# Patient Record
Sex: Male | Born: 1960 | Race: Black or African American | Hispanic: No | Marital: Single | Smoking: Former smoker
Health system: Southern US, Community
[De-identification: ages and names within clinical notes are randomized; demographics above are authoritative.]

## PROBLEM LIST (undated history)

## (undated) DIAGNOSIS — H409 Unspecified glaucoma: Secondary | ICD-10-CM

## (undated) DIAGNOSIS — I10 Essential (primary) hypertension: Secondary | ICD-10-CM

## (undated) DIAGNOSIS — K859 Acute pancreatitis without necrosis or infection, unspecified: Secondary | ICD-10-CM

## (undated) DIAGNOSIS — R531 Weakness: Secondary | ICD-10-CM

## (undated) DIAGNOSIS — M1990 Unspecified osteoarthritis, unspecified site: Secondary | ICD-10-CM

## (undated) DIAGNOSIS — H269 Unspecified cataract: Secondary | ICD-10-CM

## (undated) DIAGNOSIS — E7800 Pure hypercholesterolemia, unspecified: Secondary | ICD-10-CM

## (undated) HISTORY — DX: Unspecified osteoarthritis, unspecified site: M19.90

## (undated) HISTORY — DX: Unspecified cataract: H26.9

## (undated) HISTORY — PX: CATARACT EXTRACTION, BILATERAL: SHX1313

## (undated) HISTORY — DX: Unspecified glaucoma: H40.9

---

## 2007-01-08 ENCOUNTER — Ambulatory Visit (HOSPITAL_COMMUNITY): Admission: RE | Admit: 2007-01-08 | Discharge: 2007-01-08 | Payer: Self-pay | Admitting: *Deleted

## 2007-01-12 ENCOUNTER — Ambulatory Visit (HOSPITAL_COMMUNITY): Admission: RE | Admit: 2007-01-12 | Discharge: 2007-01-12 | Payer: Self-pay | Admitting: General Surgery

## 2008-02-04 ENCOUNTER — Ambulatory Visit (HOSPITAL_COMMUNITY): Admission: RE | Admit: 2008-02-04 | Discharge: 2008-02-04 | Payer: Self-pay | Admitting: Family Medicine

## 2008-12-18 ENCOUNTER — Ambulatory Visit (HOSPITAL_COMMUNITY): Admission: RE | Admit: 2008-12-18 | Discharge: 2008-12-18 | Payer: Self-pay | Admitting: Family Medicine

## 2011-02-04 NOTE — H&P (Signed)
NAME:  Jared Holt, Jared Holt NO.:  [id number]   MEDICAL RECORD NO.:  [id number]          PATIENT TYPE:  AMB   LOCATION:  DAY                           FACILITY:  [facility]   PHYSICIAN:  Dalia Heading, M.D.  DATE OF BIRTH:  [birth year]   DATE OF ADMISSION:  DATE OF DISCHARGE:  LH                              HISTORY & PHYSICAL   CHIEF COMPLAINT:  Hematochezia.   HISTORY OF PRESENT ILLNESS:  The patient is a 50 year old black male who  was referred for endoscopic evaluation.  He needs a colonoscopy for  hematochezia.  He noted blood in his underwear recently.  No abdominal  pain, weight loss, nausea, vomiting, diarrhea, constipation, melena have  been noted.  He has never had a colonoscopy.  There is no family history  of colon carcinoma.   PAST MEDICAL HISTORY:  Unremarkable.   PAST SURGICAL HISTORY:  Unremarkable.   CURRENT MEDICATIONS:  Cholestyramine, metoprolol.   ALLERGIES:  SULFA.   REVIEW OF SYSTEMS:  Noncontributory.   PHYSICAL EXAMINATION:  GENERAL:  Patient is a well-developed, well-  nourished black male in no acute distress.  LUNGS:  Clear to auscultation with equal breath sounds bilaterally.  HEART:  Regular rate and rhythm without S3, S4 or murmurs.  ABDOMEN:  Soft, nontender, nondistended.  No hepatosplenomegaly or  masses are noted.  RECTAL:  Deferred to the procedure.   IMPRESSION:  Hematochezia.   PLAN:  The patient is scheduled for a colonoscopy on 01/12/2007.  The  risks and benefits of the procedure including bleeding and perforation  were fully explained to the patient.  Gave informed consent.      Dalia Heading, M.D.  Electronically Signed     MAJ/MEDQ  D:  01/09/2007  T:  01/09/2007  Job:  [id number]   cc:   Kirk Ruths, M.D.  Fax: [phone]

## 2011-03-14 ENCOUNTER — Inpatient Hospital Stay (INDEPENDENT_AMBULATORY_CARE_PROVIDER_SITE_OTHER)
Admission: RE | Admit: 2011-03-14 | Discharge: 2011-03-14 | Disposition: A | Discharge status: Home / Self Care | Payer: Self-pay | Source: Ambulatory Visit | Attending: Family Medicine | Admitting: Family Medicine

## 2011-03-14 ENCOUNTER — Ambulatory Visit (INDEPENDENT_AMBULATORY_CARE_PROVIDER_SITE_OTHER): Payer: Self-pay

## 2011-03-14 DIAGNOSIS — M545 Low back pain, unspecified: Secondary | ICD-10-CM

## 2011-05-29 ENCOUNTER — Inpatient Hospital Stay (INDEPENDENT_AMBULATORY_CARE_PROVIDER_SITE_OTHER)
Admission: RE | Admit: 2011-05-29 | Discharge: 2011-05-29 | Disposition: A | Discharge status: Home / Self Care | Payer: Self-pay | Source: Ambulatory Visit | Attending: Emergency Medicine | Admitting: Emergency Medicine

## 2011-05-29 DIAGNOSIS — R0789 Other chest pain: Secondary | ICD-10-CM

## 2011-08-31 ENCOUNTER — Emergency Department (INDEPENDENT_AMBULATORY_CARE_PROVIDER_SITE_OTHER)
Admission: EM | Admit: 2011-08-31 | Discharge: 2011-08-31 | Disposition: A | Discharge status: Home / Self Care | Payer: Self-pay | Source: Home / Self Care | Attending: Family Medicine | Admitting: Family Medicine

## 2011-08-31 DIAGNOSIS — R259 Unspecified abnormal involuntary movements: Secondary | ICD-10-CM

## 2011-08-31 DIAGNOSIS — R253 Fasciculation: Secondary | ICD-10-CM

## 2011-08-31 HISTORY — DX: Essential (primary) hypertension: I10

## 2011-08-31 HISTORY — DX: Pure hypercholesterolemia, unspecified: E78.00

## 2011-08-31 HISTORY — DX: Weakness: R53.1

## 2011-08-31 MED ORDER — LORAZEPAM 0.5 MG PO TABS: 0.5000 mg | ORAL_TABLET | Freq: Three times a day (TID) | ORAL | Status: AC | PRN

## 2011-08-31 NOTE — ED Provider Notes (Signed)
History     CSN: [id number] Arrival date & time: 08/31/2011 12:37 PM   First MD Initiated Contact with Patient 08/31/11 1116      Chief Complaint  Patient presents with  . Spasms    (Consider location/radiation/quality/duration/timing/severity/associated sxs/prior treatment) HPI Comments: Jared Holt presents for evaluation of muscle twitching on the RIGHT side of his face beneath his RIGHT eye. He states that this started yesterday. The twitches last seconds, then resolve. He states that he is just concerned because he has been experiencing RIGHT sided weakness; he has seen his PCP and was referred to Neurology with whom he has an appointment on Dec. 31.  Patient is a 50 y.o. male presenting with general illness. The history is provided by the patient.  Illness  The current episode started yesterday. The problem occurs occasionally. The problem has been unchanged. The problem is mild. Pertinent negatives include no decreased vision, no ear pain and no eye pain.    Past Medical History  Diagnosis Date  . Hypertension   . High cholesterol   . Weakness     History reviewed. No pertinent past surgical history.  No family history on file.  History  Substance Use Topics  . Smoking status: Never Smoker   . Smokeless tobacco: Not on file  . Alcohol Use: Yes     social      Review of Systems  Constitutional: Negative.   HENT: Negative for ear pain.   Eyes: Negative for pain.  Respiratory: Negative.   Genitourinary: Negative.   Musculoskeletal:       Facial muscle spasm, RIGHT side beneath RIGHT eye    Allergies  Sulfa antibiotics  Home Medications   Current Outpatient Rx  Name Route Sig Dispense Refill  . IBUPROFEN 400 MG PO TABS Oral Take 400 mg by mouth every 6 (six) hours as needed.      Marland Kitchen LORAZEPAM 0.5 MG PO TABS Oral Take 1 tablet (0.5 mg total) by mouth every 8 (eight) hours as needed (Take as needed, up to every 8 hours, for muscle spasm). 30 tablet 0    BP  111/70  Pulse 108  Temp(Src) 98.4 F (36.9 C) (Oral)  Resp 18  SpO2 98%  Physical Exam  Nursing note and vitals reviewed. Constitutional: He is oriented to person, place, and time. He appears well-developed and well-nourished.  HENT:  Head: Normocephalic and atraumatic.  Right Ear: Hearing, tympanic membrane and external ear normal.  Left Ear: Hearing, tympanic membrane and external ear normal.  Mouth/Throat: Uvula is midline, oropharynx is clear and moist and mucous membranes are normal.  Eyes: EOM are normal. Pupils are equal, round, and reactive to light.  Neck: Normal range of motion.  Pulmonary/Chest: Effort normal.  Musculoskeletal: Normal range of motion.  Neurological: He is alert and oriented to person, place, and time.  Skin: Skin is warm and dry.  Psychiatric: His behavior is normal.    ED Course  Procedures (including critical care time)  Labs Reviewed - No data to display No results found.   1. Muscle twitch       MDM          Richardo Priest, MD 08/31/11 1336

## 2011-08-31 NOTE — ED Notes (Signed)
Alert, oriented and appropriate.  Steady gait, speech clear, moving all extremities, smile symmetrical

## 2011-08-31 NOTE — ED Notes (Signed)
C/o "facial spasm" of rt side of face last night and this am.  States has also had headache for which he took ibuprofen 400 mg for with some improvement.  Denies changes in speech or coordination.  States he has had rt side weakness for 2 months and his PCP has set up an appt. With the neurologist on Dec. 31st.

## 2011-08-31 NOTE — ED Notes (Signed)
Dr Juanetta Gosling notified of pts chief complaint and hx

## 2012-09-17 ENCOUNTER — Encounter (HOSPITAL_COMMUNITY): Payer: Self-pay | Admitting: Emergency Medicine

## 2012-09-17 ENCOUNTER — Emergency Department (HOSPITAL_COMMUNITY)
Admission: EM | Admit: 2012-09-17 | Discharge: 2012-09-17 | Disposition: A | Discharge status: Home / Self Care | Payer: Self-pay | Source: Home / Self Care | Attending: Family Medicine | Admitting: Family Medicine

## 2012-09-17 DIAGNOSIS — B029 Zoster without complications: Secondary | ICD-10-CM

## 2012-09-17 MED ORDER — PRAMOXINE-HC 1-1 % EX OINT: TOPICAL_OINTMENT | CUTANEOUS | Status: DC

## 2012-09-17 MED ORDER — HYDROCODONE-ACETAMINOPHEN 5-500 MG PO TABS: 1.0000 | ORAL_TABLET | Freq: Four times a day (QID) | ORAL | Status: DC | PRN

## 2012-09-17 MED ORDER — PREDNISONE 20 MG PO TABS: ORAL_TABLET | ORAL | Status: DC

## 2012-09-17 MED ORDER — IBUPROFEN 600 MG PO TABS: 600.0000 mg | ORAL_TABLET | Freq: Three times a day (TID) | ORAL | Status: DC | PRN

## 2012-09-17 MED ORDER — VALACYCLOVIR HCL 1 G PO TABS: 1000.0000 mg | ORAL_TABLET | Freq: Three times a day (TID) | ORAL | Status: AC

## 2012-09-17 NOTE — ED Provider Notes (Signed)
History     CSN: [id number]  Arrival date & time 09/17/12  1016   First MD Initiated Contact with Patient 09/17/12 1116      Chief Complaint  Patient presents with  . Rash    (Consider location/radiation/quality/duration/timing/severity/associated sxs/prior treatment) HPI Comments: 51 year old male with history of hypertension number other comorbidities. Here complaining of rash in the left side of his upper torso with radiation towards the back associated with itchiness and burning sensation, rash has been present for 3 days. Patient had chickenpox as a child. Not taking any medication for his symptoms. Also reports that he had some general malaise and headache but denies fever or chills. No nausea vomiting or diarrhea. Denies cough or congestion. No chest pain, difficulty breathing or abdominal pain.   Past Medical History  Diagnosis Date  . Hypertension   . High cholesterol   . Weakness     History reviewed. No pertinent past surgical history.  No family history on file.  History  Substance Use Topics  . Smoking status: Never Smoker   . Smokeless tobacco: Not on file  . Alcohol Use: Yes     Comment: social      Review of Systems  Constitutional: Negative for fever and chills.  HENT: Negative for congestion and neck pain.   Eyes: Negative for pain and redness.  Respiratory: Negative for cough and shortness of breath.   Gastrointestinal: Negative for nausea, vomiting, abdominal pain and diarrhea.  Skin: Positive for rash.  Neurological: Positive for headaches.  All other systems reviewed and are negative.    Allergies  Sulfa antibiotics  Home Medications   Current Outpatient Rx  Name  Route  Sig  Dispense  Refill  . GEMFIBROZIL 600 MG PO TABS   Oral   Take 600 mg by mouth 2 (two) times daily before a meal.         . LISINOPRIL 20 MG PO TABS   Oral   Take 20 mg by mouth daily.         Marland Kitchen HYDROCODONE-ACETAMINOPHEN 5-500 MG PO TABS   Oral   Take 1  tablet by mouth every 6 (six) hours as needed for pain.   20 tablet   0   . IBUPROFEN 600 MG PO TABS   Oral   Take 1 tablet (600 mg total) by mouth every 8 (eight) hours as needed for pain.   20 tablet   0   . PRAMOXINE-HC 1-1 % EX OINT      1 application in affected skin 3 times a day when necessary   1 Tube   0   . PREDNISONE 20 MG PO TABS      2 tabs by mouth daily for 5 days   10 tablet   0   . VALACYCLOVIR HCL 1 G PO TABS   Oral   Take 1 tablet (1,000 mg total) by mouth 3 (three) times daily.   21 tablet   0     BP 103/68  Pulse 66  Temp 97.2 F (36.2 C) (Oral)  Resp 12  SpO2 100%  Physical Exam  Nursing note and vitals reviewed. Constitutional: He is oriented to person, place, and time. He appears well-developed and well-nourished.  HENT:  Head: Normocephalic and atraumatic.  Mouth/Throat: Oropharynx is clear and moist.  Eyes: Conjunctivae normal and EOM are normal.  Neck: Neck supple.  Cardiovascular: Normal heart sounds.   Pulmonary/Chest: Breath sounds normal. No respiratory distress. He has no wheezes.  He has no rales. He exhibits no tenderness.  Abdominal: Soft. There is no tenderness.  Lymphadenopathy:    He has no cervical adenopathy.  Neurological: He is alert and oriented to person, place, and time.  Skin:       There is papular vesicular rash confluent in patches located in horizontal distribution from left anterior mid lateral chest and radiating to the back. No significant base swelling. No pustules or scabs. Affected area is tender to palpation.    ED Course  Procedures (including critical care time)  Labs Reviewed - No data to display No results found.   1. Herpes zoster       MDM   Classic rash for herpes zoster. Treated with valacyclovir, prednisone, ibuprofen, Vicodin and pramoxine ointment. Supportive care and red flags that should prompt his return to medical attention discussed with patient and provided in  writing.    Sharin Grave, MD 09/19/12 [phone]

## 2012-09-17 NOTE — ED Notes (Addendum)
Pt c/o rash x4 days... Rash is under left breast/chest and left side of back... Sx include pain and hurts to lay down... Denies: fevers, vomiting, nauseas, diarrhea... Has tried Vaseline... He is alert w/no signs of acute distress.

## 2013-04-15 DIAGNOSIS — E785 Hyperlipidemia, unspecified: Secondary | ICD-10-CM | POA: Insufficient documentation

## 2013-04-15 DIAGNOSIS — I10 Essential (primary) hypertension: Secondary | ICD-10-CM | POA: Insufficient documentation

## 2013-04-15 DIAGNOSIS — R251 Tremor, unspecified: Secondary | ICD-10-CM | POA: Insufficient documentation

## 2013-09-10 DIAGNOSIS — M545 Low back pain, unspecified: Secondary | ICD-10-CM | POA: Insufficient documentation

## 2013-09-24 ENCOUNTER — Encounter (HOSPITAL_COMMUNITY): Payer: Self-pay | Admitting: Emergency Medicine

## 2013-09-24 ENCOUNTER — Emergency Department (HOSPITAL_COMMUNITY)
Admission: EM | Admit: 2013-09-24 | Discharge: 2013-09-24 | Disposition: A | Discharge status: Home / Self Care | Payer: Managed Care, Other (non HMO) | Source: Home / Self Care | Attending: Family Medicine | Admitting: Family Medicine

## 2013-09-24 DIAGNOSIS — IMO0002 Reserved for concepts with insufficient information to code with codable children: Secondary | ICD-10-CM

## 2013-09-24 DIAGNOSIS — S76012A Strain of muscle, fascia and tendon of left hip, initial encounter: Secondary | ICD-10-CM

## 2013-09-24 DIAGNOSIS — H6121 Impacted cerumen, right ear: Secondary | ICD-10-CM

## 2013-09-24 DIAGNOSIS — H6120 Impacted cerumen, unspecified ear: Secondary | ICD-10-CM

## 2013-09-24 MED ORDER — DICLOFENAC POTASSIUM 50 MG PO TABS: 50.0000 mg | ORAL_TABLET | Freq: Three times a day (TID) | ORAL | Status: DC

## 2013-09-24 NOTE — ED Provider Notes (Signed)
CSN: [id number]     Arrival date & time 09/24/13  4098 History   First MD Initiated Contact with Patient 09/24/13 1053     Chief Complaint  Patient presents with  . Ear Fullness  . Hip Pain   (Consider location/radiation/quality/duration/timing/severity/associated sxs/prior Treatment) Patient is a 53 y.o. male presenting with plugged ear sensation and hip pain. The history is provided by the patient.  Ear Fullness This is a new problem. The current episode started more than 1 week ago (3 wks of sx.). The problem has not changed since onset.Pertinent negatives include no abdominal pain, no headaches and no shortness of breath.  Hip Pain This is a new problem. Episode onset: 4 wks of soreness. The problem has not changed since onset.Pertinent negatives include no abdominal pain, no headaches and no shortness of breath.    Past Medical History  Diagnosis Date  . Hypertension   . High cholesterol   . Weakness    History reviewed. No pertinent past surgical history. No family history on file. History  Substance Use Topics  . Smoking status: Never Smoker   . Smokeless tobacco: Not on file  . Alcohol Use: Yes     Comment: social    Review of Systems  Constitutional: Negative.   HENT: Positive for ear discharge and ear pain.   Respiratory: Negative for shortness of breath.   Gastrointestinal: Negative.  Negative for abdominal pain.  Genitourinary: Negative.   Musculoskeletal: Positive for gait problem and myalgias.  Skin: Negative.   Neurological: Negative for headaches.    Allergies  Sulfa antibiotics  Home Medications   Current Outpatient Rx  Name  Route  Sig  Dispense  Refill  . lisinopril (PRINIVIL,ZESTRIL) 20 MG tablet   Oral   Take 20 mg by mouth daily.         . diclofenac (CATAFLAM) 50 MG tablet   Oral   Take 1 tablet (50 mg total) by mouth 3 (three) times daily. For hip pain   30 tablet   0   . gemfibrozil (LOPID) 600 MG tablet   Oral   Take 600 mg by  mouth 2 (two) times daily before a meal.         . HYDROcodone-acetaminophen (VICODIN) 5-500 MG per tablet   Oral   Take 1 tablet by mouth every 6 (six) hours as needed for pain.   20 tablet   0   . ibuprofen (ADVIL,MOTRIN) 600 MG tablet   Oral   Take 1 tablet (600 mg total) by mouth every 8 (eight) hours as needed for pain.   20 tablet   0   . Pramoxine-HC 1-1 % OINT      1 application in affected skin 3 times a day when necessary   1 Tube   0   . predniSONE (DELTASONE) 20 MG tablet      2 tabs by mouth daily for 5 days   10 tablet   0    BP 128/84  Pulse 66  Temp(Src) 97.7 F (36.5 C) (Oral)  Resp 18  SpO2 98% Physical Exam  Nursing note and vitals reviewed. Constitutional: He is oriented to person, place, and time. He appears well-developed and well-nourished.  HENT:  Right Ear: No drainage. Decreased hearing is noted.  Left Ear: External ear normal.  Ears:  Mouth/Throat: Oropharynx is clear and moist.  Abdominal: Soft. Bowel sounds are normal.  Musculoskeletal: He exhibits tenderness.       Left hip: He exhibits  tenderness. He exhibits normal range of motion, normal strength and no bony tenderness.       Legs: Neurological: He is alert and oriented to person, place, and time.  Skin: Skin is warm and dry.    ED Course  Procedures (including critical care time) Labs Review Labs Reviewed - No data to display Imaging Review No results found.  EKG Interpretation    Date/Time:    Ventricular Rate:    PR Interval:    QRS Duration:   QT Interval:    QTC Calculation:   R Axis:     Text Interpretation:              MDM  Pt resistant to irrig, desires home rx.    Linna HoffJames D Krystle Oberman, MD 09/24/13 1146

## 2013-09-24 NOTE — Discharge Instructions (Signed)
Use murine ear wash kit at home and medicine for hip as needed. See orthopedist if further problems

## 2013-09-24 NOTE — ED Notes (Signed)
Pt c/o right ear fullness onset 3 weeks... Feels like "fluid is behind ear" Denies: f/v/n/d, cold sxs Also c/o left side hip pain onset 4 weeks... Denies: inj/trauma strenuous activity Ambulated well to exam room w/NAD He is alert w/no signs of acute distress.

## 2013-10-08 DIAGNOSIS — H6990 Unspecified Eustachian tube disorder, unspecified ear: Secondary | ICD-10-CM | POA: Insufficient documentation

## 2013-10-08 DIAGNOSIS — H6980 Other specified disorders of Eustachian tube, unspecified ear: Secondary | ICD-10-CM | POA: Insufficient documentation

## 2013-10-08 DIAGNOSIS — H9201 Otalgia, right ear: Secondary | ICD-10-CM | POA: Insufficient documentation

## 2013-10-31 DIAGNOSIS — K859 Acute pancreatitis without necrosis or infection, unspecified: Secondary | ICD-10-CM | POA: Insufficient documentation

## 2014-07-04 ENCOUNTER — Emergency Department (INDEPENDENT_AMBULATORY_CARE_PROVIDER_SITE_OTHER)
Admission: EM | Admit: 2014-07-04 | Discharge: 2014-07-04 | Disposition: A | Discharge status: Home / Self Care | Payer: Self-pay | Source: Home / Self Care | Attending: Family Medicine | Admitting: Family Medicine

## 2014-07-04 ENCOUNTER — Encounter (HOSPITAL_COMMUNITY): Payer: Self-pay | Admitting: Family Medicine

## 2014-07-04 ENCOUNTER — Other Ambulatory Visit (HOSPITAL_COMMUNITY)
Admission: RE | Admit: 2014-07-04 | Discharge: 2014-07-04 | Disposition: A | Discharge status: Home / Self Care | Payer: Self-pay | Source: Ambulatory Visit | Attending: Family Medicine | Admitting: Family Medicine

## 2014-07-04 DIAGNOSIS — Z113 Encounter for screening for infections with a predominantly sexual mode of transmission: Secondary | ICD-10-CM | POA: Insufficient documentation

## 2014-07-04 DIAGNOSIS — R1032 Left lower quadrant pain: Secondary | ICD-10-CM

## 2014-07-04 MED ORDER — PREDNISONE 50 MG PO TABS: ORAL_TABLET | ORAL | Status: DC

## 2014-07-04 MED ORDER — INDOMETHACIN 50 MG PO CAPS
50.0000 mg | ORAL_CAPSULE | Freq: Two times a day (BID) | ORAL | Status: DC
Start: 2014-07-04 — End: 2015-03-12

## 2014-07-04 NOTE — ED Provider Notes (Addendum)
CSN: [id number]     Arrival date & time 07/04/14  [id number] History   First MD Initiated Contact with Patient 07/04/14 1827     Chief Complaint  Patient presents with  . Groin Pain   (Consider location/radiation/quality/duration/timing/severity/associated sxs/prior Treatment) HPI  Groin bump. Ongoing for 3 wks. [facility] for musculoskeletal strain. No improvement. Painful. Always present but sometimes flares. 4 wks ago started new exercise regimen that may have started the bump. No heavy lifting. Daily BM. Denies penile pain or discharge, fevers. Not sexual intercourse for greater than 1 year (occasional condom use.)  SImilar issue occurred 1 year ago w/ intermittent episodes since then typically lasting for a couple months.     Past Medical History  Diagnosis Date  . Hypertension   . High cholesterol   . Weakness    History reviewed. No pertinent past surgical history. Family History  Problem Relation Age of Onset  . Heart failure Father    History  Substance Use Topics  . Smoking status: Never Smoker   . Smokeless tobacco: Not on file  . Alcohol Use: Yes     Comment: social    Review of Systems Per HPI with all other pertinent systems negative.   Allergies  Sulfa antibiotics  Home Medications   Prior to Admission medications   Medication Sig Start Date End Date Taking? Authorizing Provider  ezetimibe (ZETIA) 10 MG tablet Take 10 mg by mouth daily.   Yes Historical Provider, MD  meloxicam (MOBIC) 15 MG tablet Take 15 mg by mouth daily.   Yes Historical Provider, MD  diclofenac (CATAFLAM) 50 MG tablet Take 1 tablet (50 mg total) by mouth 3 (three) times daily. For hip pain 09/24/13   Linna HoffJames D Kindl, MD  gemfibrozil (LOPID) 600 MG tablet Take 600 mg by mouth 2 (two) times daily before a meal.    Historical Provider, MD  HYDROcodone-acetaminophen (VICODIN) 5-500 MG per tablet Take 1 tablet by mouth every 6 (six) hours as needed for pain. 09/17/12    Adlih Moreno-Coll, MD  ibuprofen (ADVIL,MOTRIN) 600 MG tablet Take 1 tablet (600 mg total) by mouth every 8 (eight) hours as needed for pain. 09/17/12   Adlih Moreno-Coll, MD  indomethacin (INDOCIN) 50 MG capsule Take 1 capsule (50 mg total) by mouth 2 (two) times daily with a meal. 07/04/14   Ozella Rocksavid J Shalae Belmonte, MD  lisinopril (PRINIVIL,ZESTRIL) 20 MG tablet Take 20 mg by mouth daily.    Historical Provider, MD  Pramoxine-HC 1-1 % OINT 1 application in affected skin 3 times a day when necessary 09/17/12   Sharin GraveAdlih Moreno-Coll, MD  predniSONE (DELTASONE) 50 MG tablet 2 tabs by mouth daily for 5 days 07/04/14   Ozella Rocksavid J Miracle Criado, MD   BP 129/79  Pulse 60  Temp(Src) 97.7 F (36.5 C) (Oral)  Resp 14  SpO2 99% Physical Exam  Constitutional: He appears well-developed and well-nourished. No distress.  HENT:  Head: Normocephalic and atraumatic.  Eyes: EOM are normal. Pupils are equal, round, and reactive to light.  Cardiovascular: Normal rate and normal heart sounds.   Pulmonary/Chest: Effort normal and breath sounds normal.  Abdominal: Soft. He exhibits no distension.  Genitourinary:  Penis and testicles nml. No lesions. Circumcised  No groin lymphadenopathy or inguinal hernia.  Mild to no L anterior groin swelling   Musculoskeletal:  Strength 5/5 w/ hip flexion, extension, abduction, adductiohn FROM Nonttp.   Skin: Skin is warm and dry. He is not diaphoretic.  Psychiatric: He has a normal mood and affect. His behavior is normal. Judgment and thought content normal.    ED Course  Procedures (including critical care time) Labs Review Labs Reviewed  RPR  HIV ANTIBODY (ROUTINE TESTING)  URINE CYTOLOGY ANCILLARY ONLY    Imaging Review No results found.   MDM   1. Groin pain, left   [facility]  Likely muscle strain or tear w/ possible early arthiritis Syphilis also a possibility (STD check) - though unlikely  Start prednisone adn PT CHange to  indomethacin from meloxicam after prednisone Start exercises Heat and massage Discuss formal PT and or MRI w/ PCP Precautions given and all questions answered  Shelly Flattenavid Shayden Bobier, MD Family Medicine 07/04/2014, 7:13 PM      Ozella Rocksavid J Syesha Thaw, MD 07/04/14 1913  Ozella Rocksavid J Malori Myers, MD 07/04/14 [phone]

## 2014-07-04 NOTE — Discharge Instructions (Signed)
Your symptoms are all likely from mild arthritis of the hip or from muscle strain or tear Please stop the meloxicam and start the prednisone Please start the indomethicin after stopping the prednisone Please follow up with your regular doctor if your pain persists as you may need physical therapy or an MRI Please start the exercises below as you are able.   Hip Exercises RANGE OF MOTION (ROM) AND STRETCHING EXERCISES  These exercises may help you when beginning to rehabilitate your injury. Doing them too aggressively can worsen your condition. Complete them slowly and gently. Your symptoms may resolve with or without further involvement from your physician, physical therapist or athletic trainer. While completing these exercises, remember:   Restoring tissue flexibility helps normal motion to return to the joints. This allows healthier, less painful movement and activity.  An effective stretch should be held for at least 30 seconds.  A stretch should never be painful. You should only feel a gentle lengthening or release in the stretched tissue. If these stretches worsen your symptoms even when done gently, consult your physician, physical therapist or athletic trainer. STRETCH - Hamstrings, Supine   Lie on your back. Loop a belt or towel over the ball of your right / left foot.  Straighten your right / left knee and slowly pull on the belt to raise your leg. Do not allow the right / left knee to bend. Keep your opposite leg flat on the floor.  Raise the leg until you feel a gentle stretch behind your right / left knee or thigh. Hold this position for __________ seconds. Repeat __________ times. Complete this stretch __________ times per day.  STRETCH - Hip Rotators   Lie on your back on a firm surface. Grasp your right / left knee with your right / left hand and your ankle with your opposite hand.  Keeping your hips and shoulders firmly planted, gently pull your right / left knee and  rotate your lower leg toward your opposite shoulder until you feel a stretch in your buttocks.  Hold this stretch for __________ seconds. Repeat this stretch __________ times. Complete this stretch __________ times per day. STRETCH - Hamstrings/Adductors, V-Sit   Sit on the floor with your legs extended in a large "V," keeping your knees straight.  With your head and chest upright, bend at your waist reaching for your right foot to stretch your left adductors.  You should feel a stretch in your left inner thigh. Hold for __________ seconds.  Return to the upright position to relax your leg muscles.  Continuing to keep your chest upright, bend straight forward at your waist to stretch your hamstrings.  You should feel a stretch behind both of your thighs and/or knees. Hold for __________ seconds.  Return to the upright position to relax your leg muscles.  Repeat steps 2 through 4 for opposite leg. Repeat __________ times. Complete this exercise __________ times per day.  STRETCHING - Hip Flexors, Lunge  Half kneel with your right / left knee on the floor and your opposite knee bent and directly over your ankle.  Keep good posture with your head over your shoulders. Tighten your buttocks to point your tailbone downward; this will prevent your back from arching too much.  You should feel a gentle stretch in the front of your thigh and/or hip. If you do not feel any resistance, slightly slide your opposite foot forward and then slowly lunge forward so your knee once again lines up over your  ankle. Be sure your tailbone remains pointed downward.  Hold this stretch for __________ seconds. Repeat __________ times. Complete this stretch __________ times per day. STRENGTHENING EXERCISES These exercises may help you when beginning to rehabilitate your injury. They may resolve your symptoms with or without further involvement from your physician, physical therapist or athletic trainer. While  completing these exercises, remember:   Muscles can gain both the endurance and the strength needed for everyday activities through controlled exercises.  Complete these exercises as instructed by your physician, physical therapist or athletic trainer. Progress the resistance and repetitions only as guided.  You may experience muscle soreness or fatigue, but the pain or discomfort you are trying to eliminate should never worsen during these exercises. If this pain does worsen, stop and make certain you are following the directions exactly. If the pain is still present after adjustments, discontinue the exercise until you can discuss the trouble with your clinician. STRENGTH - Hip Extensors, Bridge   Lie on your back on a firm surface. Bend your knees and place your feet flat on the floor.  Tighten your buttocks muscles and lift your bottom off the floor until your trunk is level with your thighs. You should feel the muscles in your buttocks and back of your thighs working. If you do not feel these muscles, slide your feet 1-2 inches further away from your buttocks.  Hold this position for __________ seconds.  Slowly lower your hips to the starting position and allow your buttock muscles relax completely before beginning the next repetition.  If this exercise is too easy, you may cross your arms over your chest. Repeat __________ times. Complete this exercise __________ times per day.  STRENGTH - Hip Abductors, Straight Leg Raises  Be aware of your form throughout the entire exercise so that you exercise the correct muscles. Sloppy form means that you are not strengthening the correct muscles.  Lie on your side so that your head, shoulders, knee and hip line up. You may bend your lower knee to help maintain your balance. Your right / left leg should be on top.  Roll your hips slightly forward, so that your hips are stacked directly over each other and your right / left knee is facing  forward.  Lift your top leg up 4-6 inches, leading with your heel. Be sure that your foot does not drift forward or that your knee does not roll toward the ceiling.  Hold this position for __________ seconds. You should feel the muscles in your outer hip lifting (you may not notice this until your leg begins to tire).  Slowly lower your leg to the starting position. Allow the muscles to fully relax before beginning the next repetition. Repeat __________ times. Complete this exercise __________ times per day.  STRENGTH - Hip Adductors, Straight Leg Raises   Lie on your side so that your head, shoulders, knee and hip line up. You may place your upper foot in front to help maintain your balance. Your right / left leg should be on the bottom.  Roll your hips slightly forward, so that your hips are stacked directly over each other and your right / left knee is facing forward.  Tense the muscles in your inner thigh and lift your bottom leg 4-6 inches. Hold this position for __________ seconds.  Slowly lower your leg to the starting position. Allow the muscles to fully relax before beginning the next repetition. Repeat __________ times. Complete this exercise __________ times per day.  STRENGTH - Quadriceps, Straight Leg Raises  Quality counts! Watch for signs that the quadriceps muscle is working to insure you are strengthening the correct muscles and not "cheating" by substituting with healthier muscles.  Lay on your back with your right / left leg extended and your opposite knee bent.  Tense the muscles in the front of your right / left thigh. You should see either your knee cap slide up or increased dimpling just above the knee. Your thigh may even quiver.  Tighten these muscles even more and raise your leg 4 to 6 inches off the floor. Hold for right / left seconds.  Keeping these muscles tense, lower your leg.  Relax the muscles slowly and completely in between each repetition. Repeat  __________ times. Complete this exercise __________ times per day.  STRENGTH - Hip Abductors, Standing  Tie one end of a rubber exercise band/tubing to a secure surface (table, pole) and tie a loop at the other end.  Place the loop around your right / left ankle. Keeping your ankle with the band directly opposite of the secured end, step away until there is tension in the tube/band.  Hold onto a chair as needed for balance.  Keeping your back upright, your shoulders over your hips, and your toes pointing forward, lift your right / left leg out to your side. Be sure to lift your leg with your hip muscles. Do not "throw" your leg or tip your body to lift your leg.  Slowly and with control, return to the starting position. Repeat exercise __________ times. Complete this exercise __________ times per day.  STRENGTH - Quadriceps, Squats  Stand in a door frame so that your feet and knees are in line with the frame.  Use your hands for balance, not support, on the frame.  Slowly lower your weight, bending at the hips and knees. Keep your lower legs upright so that they are parallel with the door frame. Squat only within the range that does not increase your knee pain. Never let your hips drop below your knees.  Slowly return upright, pushing with your legs, not pulling with your hands. Document Released: 09/23/2005 Document Revised: 11/28/2011 Document Reviewed: 12/18/2008 [facility] Patient Information 2015 [employer] [place]. This information is not intended to replace advice given to you by your health care provider. Make sure you discuss any questions you have with your health care provider.

## 2014-07-04 NOTE — ED Notes (Signed)
C/o groin pain onset 2 weeks Sx include swelling [facility]; given meloxicam Denies rashes, inj/trauma Alert, no signs of acute distress.

## 2014-07-05 LAB — RPR

## 2014-07-05 LAB — HIV ANTIBODY (ROUTINE TESTING W REFLEX): HIV: NONREACTIVE

## 2014-07-07 LAB — URINE CYTOLOGY ANCILLARY ONLY
CHLAMYDIA, DNA PROBE: NEGATIVE
Neisseria Gonorrhea: NEGATIVE
TRICH (WINDOWPATH): NEGATIVE

## 2015-03-12 ENCOUNTER — Emergency Department (INDEPENDENT_AMBULATORY_CARE_PROVIDER_SITE_OTHER)
Admission: EM | Admit: 2015-03-12 | Discharge: 2015-03-12 | Disposition: A | Discharge status: Home / Self Care | Payer: Self-pay | Source: Home / Self Care | Attending: Family Medicine | Admitting: Family Medicine

## 2015-03-12 ENCOUNTER — Emergency Department (INDEPENDENT_AMBULATORY_CARE_PROVIDER_SITE_OTHER): Payer: Self-pay

## 2015-03-12 ENCOUNTER — Encounter (HOSPITAL_COMMUNITY): Payer: Self-pay | Admitting: Emergency Medicine

## 2015-03-12 DIAGNOSIS — R1030 Lower abdominal pain, unspecified: Secondary | ICD-10-CM

## 2015-03-12 LAB — POCT URINALYSIS DIP (DEVICE)
BILIRUBIN URINE: NEGATIVE
GLUCOSE, UA: NEGATIVE mg/dL
Hgb urine dipstick: NEGATIVE
KETONES UR: NEGATIVE mg/dL
Leukocytes, UA: NEGATIVE
Nitrite: NEGATIVE
Protein, ur: NEGATIVE mg/dL
SPECIFIC GRAVITY, URINE: 1.02 (ref 1.005–1.030)
Urobilinogen, UA: 0.2 mg/dL (ref 0.0–1.0)
pH: 6 (ref 5.0–8.0)

## 2015-03-12 MED ORDER — PREDNISONE 5 MG (21) PO TBPK: 5.0000 mg | ORAL_TABLET | Freq: Every day | ORAL | Status: DC

## 2015-03-12 MED ORDER — HYDROCODONE-ACETAMINOPHEN 5-325 MG PO TABS: 1.0000 | ORAL_TABLET | Freq: Four times a day (QID) | ORAL | Status: DC | PRN

## 2015-03-12 NOTE — ED Notes (Signed)
C/o groin pain and swelling onset 1 week; intermittent pain Denies inj/trauma Has already seen neurologist and orthopedic for this Alert, no signs of acute distress.

## 2015-03-12 NOTE — ED Provider Notes (Signed)
Jared Holt is a 54 y.o. male who presents to Urgent Care today for left groin pain. Patient has chronic intermittent pain in his left groin. This is been evaluated by multiple different providers at different locations in the past. He describes the pain is sharp and severe. It does not seem to be worse with hip motion. It is better with tramadol. No weakness or numbness. He denies any swelling in his groin or his scrotum. No urinary symptoms. No fevers or chills. He notes that in the past he has done well with steroid courses. He's tried tramadol which hasn't helped much for this problem.   Past Medical History  Diagnosis Date  . Hypertension   . High cholesterol   . Weakness    History reviewed. No pertinent past surgical history. History  Substance Use Topics  . Smoking status: Never Smoker   . Smokeless tobacco: Not on file  . Alcohol Use: Yes     Comment: social   ROS as above Medications: No current facility-administered medications for this encounter.   Current Outpatient Prescriptions  Medication Sig Dispense Refill  . lisinopril (PRINIVIL,ZESTRIL) 20 MG tablet Take 20 mg by mouth daily.    Marland Kitchen ezetimibe (ZETIA) 10 MG tablet Take 10 mg by mouth daily.    Marland Kitchen gemfibrozil (LOPID) 600 MG tablet Take 600 mg by mouth 2 (two) times daily before a meal.    . HYDROcodone-acetaminophen (NORCO/VICODIN) 5-325 MG per tablet Take 1 tablet by mouth every 6 (six) hours as needed. 9 tablet 0  . Pramoxine-HC 1-1 % OINT 1 application in affected skin 3 times a day when necessary 1 Tube 0  . predniSONE (STERAPRED UNI-PAK 21 TAB) 5 MG (21) TBPK tablet Take 1 tablet (5 mg total) by mouth daily. 6 day dosepack po 21 tablet 0   Allergies  Allergen Reactions  . Sulfa Antibiotics      Exam:  BP 99/69 mmHg  Pulse 69  Temp(Src) 97.4 F (36.3 C) (Oral)  Resp 16  SpO2 98% Gen: Well NAD HEENT: EOMI,  MMM Lungs: Normal work of breathing. CTABL Heart: RRR no MRG Abd: NABS, Soft. Nondistended,  Nontender Exts: Brisk capillary refill, warm and well perfused.  Groin normal-appearing no swelling nontender. Hip normal-appearing normal hip motion stable exam. Capillary refill sensation are intact distally.    Results for orders placed or performed during the hospital encounter of 03/12/15 (from the past 24 hour(s))  POCT urinalysis dip (device)     Status: None   Collection Time: 03/12/15  3:32 PM  Result Value Ref Range   Glucose, UA NEGATIVE NEGATIVE mg/dL   Bilirubin Urine NEGATIVE NEGATIVE   Ketones, ur NEGATIVE NEGATIVE mg/dL   Specific Gravity, Urine 1.020 1.005 - 1.030   Hgb urine dipstick NEGATIVE NEGATIVE   pH 6.0 5.0 - 8.0   Protein, ur NEGATIVE NEGATIVE mg/dL   Urobilinogen, UA 0.2 0.0 - 1.0 mg/dL   Nitrite NEGATIVE NEGATIVE   Leukocytes, UA NEGATIVE NEGATIVE   Dg Hip Unilat With Pelvis 2-3 Views Left  03/12/2015   CLINICAL DATA:  Left hip pain for over 1 week. No known injury. Initial encounter.  EXAM: LEFT HIP (WITH PELVIS) 2-3 VIEWS  COMPARISON:  None.  FINDINGS: There is no evidence of hip fracture or dislocation. There is no evidence of arthropathy or other focal bone abnormality.  IMPRESSION: Negative exam.   Electronically Signed   By: Drusilla Kanner M.D.   On: 03/12/2015 16:10    Assessment and Plan:  54 y.o. male with hip pain chronic issue at this time. The etiology is certainly unclear. Plan for short steroid course as well as small amount of Norco (9 tablets). Follow-up with PCP.   Discussed warning signs or symptoms. Please see discharge instructions. Patient expresses understanding.     Rodolph Bong, MD 03/12/15 [phone]

## 2015-03-12 NOTE — Discharge Instructions (Signed)
Thank you for coming in today. Take prednisone and norco.  Follow up with your doctor in [facility].  Come back or go to the emergency room if you notice new weakness new numbness problems walking or bowel or bladder problems.  Please call or see Ms Antionette Char for assistance with your bill.  You may qualify for reduced or free services.  Her phone number is [phone]. Her email is [email or web address]   Hip Pain Your hip is the joint between your upper legs and your lower pelvis. The bones, cartilage, tendons, and muscles of your hip joint perform a lot of work each day supporting your body weight and allowing you to move around. Hip pain can range from a minor ache to severe pain in one or both of your hips. Pain may be felt on the inside of the hip joint near the groin, or the outside near the buttocks and upper thigh. You may have swelling or stiffness as well.  HOME CARE INSTRUCTIONS   Take medicines only as directed by your health care provider.  Apply ice to the injured area:  Put ice in a plastic bag.  Place a towel between your skin and the bag.  Leave the ice on for 15-20 minutes at a time, 3-4 times a day.  Keep your leg raised (elevated) when possible to lessen swelling.  Avoid activities that cause pain.  Follow specific exercises as directed by your health care provider.  Sleep with a pillow between your legs on your most comfortable side.  Record how often you have hip pain, the location of the pain, and what it feels like. SEEK MEDICAL CARE IF:   You are unable to put weight on your leg.  Your hip is red or swollen or very tender to touch.  Your pain or swelling continues or worsens after 1 week.  You have increasing difficulty walking.  You have a fever. SEEK IMMEDIATE MEDICAL CARE IF:   You have fallen.  You have a sudden increase in pain and swelling in your hip. MAKE SURE YOU:   Understand these instructions.  Will watch  your condition.  Will get help right away if you are not doing well or get worse. Document Released: 02/23/2010 Document Revised: 01/20/2014 Document Reviewed: 05/02/2013 [facility] Patient Information 2015 [place], [place]. This information is not intended to replace advice given to you by your health care provider. Make sure you discuss any questions you have with your health care provider.

## 2015-09-08 ENCOUNTER — Encounter: Payer: Self-pay | Admitting: Student

## 2015-09-08 ENCOUNTER — Ambulatory Visit (INDEPENDENT_AMBULATORY_CARE_PROVIDER_SITE_OTHER): Payer: 59 | Admitting: Student

## 2015-09-08 VITALS — BP 112/70 | HR 96 | Temp 97.8°F | Ht 70.5 in | Wt 187.0 lb

## 2015-09-08 DIAGNOSIS — Z0000 Encounter for general adult medical examination without abnormal findings: Secondary | ICD-10-CM

## 2015-09-08 DIAGNOSIS — Z23 Encounter for immunization: Secondary | ICD-10-CM

## 2015-09-08 DIAGNOSIS — Z7184 Encounter for health counseling related to travel: Secondary | ICD-10-CM

## 2015-09-08 DIAGNOSIS — Z7189 Other specified counseling: Secondary | ICD-10-CM

## 2015-09-08 DIAGNOSIS — B54 Unspecified malaria: Secondary | ICD-10-CM

## 2015-09-08 DIAGNOSIS — E785 Hyperlipidemia, unspecified: Secondary | ICD-10-CM | POA: Diagnosis not present

## 2015-09-08 DIAGNOSIS — I10 Essential (primary) hypertension: Secondary | ICD-10-CM

## 2015-09-08 DIAGNOSIS — N529 Male erectile dysfunction, unspecified: Secondary | ICD-10-CM | POA: Diagnosis not present

## 2015-09-08 DIAGNOSIS — D696 Thrombocytopenia, unspecified: Secondary | ICD-10-CM

## 2015-09-08 DIAGNOSIS — M25561 Pain in right knee: Secondary | ICD-10-CM

## 2015-09-08 LAB — BASIC METABOLIC PANEL WITH GFR
BUN: 14 mg/dL (ref 7–25)
CALCIUM: 9.7 mg/dL (ref 8.6–10.3)
CO2: 29 mmol/L (ref 20–31)
CREATININE: 1.18 mg/dL (ref 0.70–1.33)
Chloride: 101 mmol/L (ref 98–110)
GFR, Est African American: 80 mL/min (ref >=60)
GFR, Est Non African American: 69 mL/min (ref >=60)
Glucose, Bld: 74 mg/dL (ref 65–99)
Potassium: 4.5 mmol/L (ref 3.5–5.3)
SODIUM: 138 mmol/L (ref 135–146)

## 2015-09-08 LAB — LIPID PANEL
CHOLESTEROL: 225 mg/dL — AB (ref 125–200)
HDL: 43 mg/dL (ref >=40)
LDL Cholesterol: 148 mg/dL — ABNORMAL HIGH (ref <130)
Total CHOL/HDL Ratio: 5.2 Ratio — ABNORMAL HIGH (ref <=5.0)
Triglycerides: 168 mg/dL — ABNORMAL HIGH (ref <150)
VLDL: 34 mg/dL — AB (ref <30)

## 2015-09-08 LAB — POCT GLYCOSYLATED HEMOGLOBIN (HGB A1C): HEMOGLOBIN A1C: 5.5

## 2015-09-08 MED ORDER — LISINOPRIL 20 MG PO TABS: 20.0000 mg | ORAL_TABLET | Freq: Every day | ORAL | Status: DC

## 2015-09-08 MED ORDER — VIAGRA 100 MG PO TABS: ORAL_TABLET | ORAL | Status: DC

## 2015-09-08 MED ORDER — DOXYCYCLINE HYCLATE 100 MG PO TABS: 100.0000 mg | ORAL_TABLET | Freq: Every day | ORAL | Status: DC

## 2015-09-08 MED ORDER — EZETIMIBE 10 MG PO TABS: 10.0000 mg | ORAL_TABLET | Freq: Every day | ORAL | Status: DC

## 2015-09-08 NOTE — Assessment & Plan Note (Signed)
Due for colonoscopy on 04/30/2017 per care everywhere. Gave flu shot today Declined screening for STD today Checking A1c and lipid panel PHQ-2: negative Has seen dentist this year. Has been to his eye doctor as well. Discussed healthy life style (eating right, exercise for 150 minutes a week, no more than 2 drinks a day)

## 2015-09-08 NOTE — Progress Notes (Signed)
Subjective:    Patient ID: Jared Holt, male    DOB: 06/08/1961, 54 y.o.   MRN: [id number]  HPI Dull pain in his right knee: for two weeks. Started when he got out of his car after long drive two weeks ago. No trauma. Goes away with rest. Pain 3-4 at its worst. Some swelling on medial side. Never had this before although he had some hip issues on the left side. He strained his hip after lifting some weight about 2 years ago. His left hip has improved with physical therapy.   Hypertension: takes lisinopril 20 mg daily. Checks his BP at home (usually about 120/80)  Hyperlipidemia: takes Zetia. Had myalgia with statins  Says he is uptodate on his vaccines except flu shot. He reports getting them at [facility] departement.  Says he has an appointment with eye doctor on Friday.  Says he has been to dentist on in October.  Says he had colonoscopy at [facility] 8 years ago which was normal. Next 04/30/2017 per care everywhere  [facility]: father with heart disease in his 5440's. Also stroke later. Passed away in his 3880's. Sister with diabetes. 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 Denies cigarette. Admits drinking EtOH occasionally. Denies recreational drugs.  [place] to [place]: for work. He will be travelling in one week. He will be there for 4 months. 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 He came back from [place] recently.He has been to [place] this year. Says he is uptodate on his vaccines including yellow fever and typhoid. He reports getting his vaccines at health department, most recently beginning of this year.   PHQ-2-0  Review of Systems Denies fever, unintentional weight loss, abdominal pain.    Objective:   Physical Exam Gen: appears-well Eyes: pupils equal, round and reactive to light Nares: clear, no erythema, swelling or congestion Oropharynx: clear, moist Neck: supple, no LAD CV: regular rate and rythm. S1 & S2 audible, no murmurs. Resp: no apparent work of breathing,  clear to auscultation bilaterally. GI: bowel sounds normal, no tenderness to palpation, no rebound or guarding, no mass.  GU: deferred. Denies swelling or lesion Skin: no lesion MSK: right knee slightly swollen and tender medially, mild crepitus. Has full range of motion.     Assessment & Plan:  Right knee pain Likely osteoarthritis. No history of trauma. Pain 3/10 at its worst. He is not in pain now. No constitutional symptoms. Mild swelling or tenderness medially. He has full range of motion. Valgus and varus signs negative. Recommended getting knee brace if pain is limiting, which is not the case at this time.  BP (high blood pressure) Well controlled with lisinopril. Gave refills today. Checking BMP as well.  HLD (hyperlipidemia) On Zetia due to myalgia with other statins. He has significant family history of ASCD. Will check his lipid panel today.   Erectile dysfunction Refilled his viagra today  Travel advice encounter [place]ing to [place] in [place] days. He has been to [place] this year. Says he is uptodate on his vaccines including yellow fever and typhoid. He reports getting his vaccines at health department, most recently beginning of this year. Gave lisinopril and Zetia for 6 months. Doxy 100 mg daily for malaria prophylaxis. Also advised to go to [employer] web site for more information.   Routine health maintenance Due for colonoscopy on 04/30/2017 per care everywhere. Gave flu shot today Declined screening for STD today Checking A1c and lipid panel PHQ-2: negative Has seen dentist this  year. Has been to his eye doctor as well. Discussed healthy life style (eating right, exercise for 150 minutes a week, no more than 2 drinks a day)

## 2015-09-08 NOTE — Patient Instructions (Signed)
It was great seeing you today! We have addressed the following issues today  1. Hypertension: sent a prescription to your pharmacy 2. Hyperlipidemia: sent a prescription to pharmacy 3. Travel to [place]: I will send prescription to your pharmacy. 4. recommned going to [employer] web site and reading about [place]   If we did any lab work today, and the results require attention, either me or my nurse will get in touch with you. If everything is normal, you will get a letter in mail. If you don't hear from us in two weeks, please give us a call. Otherwise, I look forward to talking with you again at our next visit. If you have any questions or concerns before then, please call the clinic at [phone].  Please bring all your medications to every doctors visit   Sign up for My Chart to have easy access to your labs results, and communication with your Primary care physician.    Please check-out at the front desk before leaving the clinic.   Take Care,

## 2015-09-08 NOTE — Assessment & Plan Note (Signed)
Refilled his viagra today

## 2015-09-08 NOTE — Assessment & Plan Note (Signed)
Well controlled with lisinopril. Gave refills today. Checking BMP as well.

## 2015-09-08 NOTE — Assessment & Plan Note (Signed)
Likely osteoarthritis. No history of trauma. Pain 3/10 at its worst. He is not in pain now. No constitutional symptoms. Mild swelling or tenderness medially. He has full range of motion. Valgus and varus signs negative. Recommended getting knee brace if pain is limiting, which is not the case at this time.

## 2015-09-08 NOTE — Assessment & Plan Note (Addendum)
[place]. He has been to [place] this year. Says he is uptodate on his vaccines including yellow fever and typhoid. He reports getting his vaccines at health department, most recently beginning of this year. Gave lisinopril and Zetia for 6 months. Doxy 100 mg daily for malaria prophylaxis. Also advised to go to [employer] web site for more information.

## 2015-09-08 NOTE — Assessment & Plan Note (Addendum)
On Zetia due to myalgia with other statins. He has significant family history of ASCD. Will check his lipid panel today.

## 2015-09-15 ENCOUNTER — Encounter: Payer: Self-pay | Admitting: Student

## 2015-10-15 ENCOUNTER — Encounter: Payer: Self-pay | Admitting: Student

## 2015-10-16 ENCOUNTER — Encounter: Payer: Self-pay | Admitting: Student

## 2015-11-30 ENCOUNTER — Telehealth: Payer: 59 | Admitting: Family

## 2015-11-30 ENCOUNTER — Encounter: Payer: Self-pay | Admitting: Student

## 2015-11-30 DIAGNOSIS — M5440 Lumbago with sciatica, unspecified side: Secondary | ICD-10-CM

## 2015-11-30 MED ORDER — BACLOFEN 10 MG PO TABS: 10.0000 mg | ORAL_TABLET | Freq: Three times a day (TID) | ORAL | Status: DC | PRN

## 2015-11-30 MED ORDER — NAPROXEN 500 MG PO TABS: 500.0000 mg | ORAL_TABLET | Freq: Two times a day (BID) | ORAL | Status: DC

## 2015-11-30 NOTE — Progress Notes (Signed)

## 2015-12-13 ENCOUNTER — Encounter: Payer: Self-pay | Admitting: Student

## 2015-12-16 ENCOUNTER — Encounter: Payer: Self-pay | Admitting: Student

## 2015-12-30 ENCOUNTER — Encounter: Payer: Self-pay | Admitting: Student

## 2016-01-14 ENCOUNTER — Encounter: Payer: Self-pay | Admitting: Student

## 2016-01-21 ENCOUNTER — Other Ambulatory Visit (HOSPITAL_COMMUNITY)
Admission: RE | Admit: 2016-01-21 | Discharge: 2016-01-21 | Disposition: A | Discharge status: Home / Self Care | Payer: 59 | Source: Ambulatory Visit | Attending: Family Medicine | Admitting: Family Medicine

## 2016-01-21 ENCOUNTER — Encounter: Payer: Self-pay | Admitting: Student

## 2016-01-21 ENCOUNTER — Ambulatory Visit (INDEPENDENT_AMBULATORY_CARE_PROVIDER_SITE_OTHER): Payer: 59 | Admitting: Student

## 2016-01-21 VITALS — BP 120/61 | HR 70 | Temp 98.1°F | Wt 188.0 lb

## 2016-01-21 DIAGNOSIS — I10 Essential (primary) hypertension: Secondary | ICD-10-CM

## 2016-01-21 DIAGNOSIS — M545 Low back pain, unspecified: Secondary | ICD-10-CM

## 2016-01-21 DIAGNOSIS — E785 Hyperlipidemia, unspecified: Secondary | ICD-10-CM | POA: Diagnosis not present

## 2016-01-21 DIAGNOSIS — Z113 Encounter for screening for infections with a predominantly sexual mode of transmission: Secondary | ICD-10-CM | POA: Insufficient documentation

## 2016-01-21 DIAGNOSIS — Z7184 Encounter for health counseling related to travel: Secondary | ICD-10-CM

## 2016-01-21 DIAGNOSIS — Z7189 Other specified counseling: Secondary | ICD-10-CM

## 2016-01-21 DIAGNOSIS — B54 Unspecified malaria: Secondary | ICD-10-CM

## 2016-01-21 DIAGNOSIS — Z7251 High risk heterosexual behavior: Secondary | ICD-10-CM

## 2016-01-21 DIAGNOSIS — N529 Male erectile dysfunction, unspecified: Secondary | ICD-10-CM | POA: Diagnosis not present

## 2016-01-21 MED ORDER — LISINOPRIL 20 MG PO TABS: 20.0000 mg | ORAL_TABLET | Freq: Every day | ORAL | Status: DC

## 2016-01-21 MED ORDER — TADALAFIL 5 MG PO TABS: 5.0000 mg | ORAL_TABLET | ORAL | Status: DC | PRN

## 2016-01-21 MED ORDER — EZETIMIBE 10 MG PO TABS: 10.0000 mg | ORAL_TABLET | Freq: Every day | ORAL | Status: DC

## 2016-01-21 MED ORDER — DOXYCYCLINE HYCLATE 100 MG PO TABS: 100.0000 mg | ORAL_TABLET | Freq: Every day | ORAL | Status: DC

## 2016-01-21 NOTE — Progress Notes (Signed)
   Subjective:    Patient ID: Jared Holt, male    DOB: 07/05/1961, 55 y.o.   MRN: [id number]  CC: back pain  HPI #Hyperlipidemia: history of myalgia with statins in the past. Says his doctors tried statins many times in the past and he couldn't tolerate them.   #Back pain: had fall when he was lifting weight 10 years ago. He had back x-ray at that time, and he was told that he had muscle strain, no fracture. He had another imaging of the back in [place] about a year ago when he had another back pain while lifting weight at work. He was told that he had some "bending of his spinal cord". He reports some pain in his legs, particularly his knees on and off. Denies fever, weakness numbness and tingling in his legs, saddle paresthesia,  urinary or stool incontinence, nighttime back pain or unintentional weight loss.   #STD screening: He he just got back from [place]  5 days ago. He is planning to go back next week. Reports having unprotected sex while in [place] although he intermittently uses condom. Denies penile discharge, lesion or scrotal swelling.   ROS per HPI Objective:   Physical Exam Filed Vitals:   01/21/16 1328  BP: 120/61  Pulse: 70  Temp: 98.1 F (36.7 C)  TempSrc: Oral  Weight: 188 lb (85.276 kg)    GEN: appears well, NAD Oropharynx: clear, moist Neck: supple, no LAD CVS: RRR, normal s1 and s2, no murmurs, no edema RESP: no increased work of breathing, good air movement bilaterally, no crackles or wheeze GI: soft, non-tender,non-distended, +BS MSK: no tenderness to palpation over his back, full range of motion, straight leg raise negative,  NEURO: no gross defecits, patellar reflex 2+ bilaterally, sensation intact, motor 5/5 in both LE's. PSYCH: appropriate mood and affect     Assessment & Plan:  HLD (hyperlipidemia) ASCVD risk 11%. Discussed this with the patient. However, he reports trying statins multiple time in the past and he couldn't tolerate them. So, we will  continue his Zetia and lifestyle changes. I have discussed about diet an exercise with the patient, and gave him handout  LBP (low back pain) He is pain free now. No red flags on history and exam. Discussed some tips about back injury prevention and gave handouts  Unprotected sex Reports intermittent unprotected sex when he was back in [place]. Denies any penile discharge or lesion. Will check HIV, gonorrhea/chlamydia, RPR. Advised to use condoms.   BP (high blood pressure) Normotensive. Gave 6 month refill on his lisinopril  Travel advice encounter Gave 6 months worth of doxycyline for malaria ppx.   Erectile dysfunction Gave Rx for Cialis 5 mg #40

## 2016-01-21 NOTE — Assessment & Plan Note (Signed)
Normotensive. Gave 6 month refill on his lisinopril

## 2016-01-21 NOTE — Assessment & Plan Note (Signed)
Reports intermittent unprotected sex when he was back in [place]. Denies any penile discharge or lesion. Will check HIV, gonorrhea/chlamydia, RPR. Advised to use condoms.

## 2016-01-21 NOTE — Assessment & Plan Note (Signed)
Gave Rx for Cialis 5 mg #40

## 2016-01-21 NOTE — Patient Instructions (Signed)
It was great seeing you today! We have addressed the following issues today   Cholesterol: continue taking Zetia although this is not know to decrease your risk of stroke. Unfortunately, you have statin intolerance which was ideal for stroke prevention. Daily exercise and eating right could help.  Back pain: I have low suspicion for anything serious. You could have muscle spasm. Please, read below about back pain for more information.    If we did any lab work today, and the results require attention, either me or my nurse will get in touch with you. If everything is normal, you will get a letter in mail. If you don't hear from [place] in two weeks, please give [place] a call. Otherwise, I look forward to talking with you again at our next visit. If you have any questions or concerns before then, please call the clinic at [phone].  Please bring all your medications to every doctors visit   Sign up for My Chart to have easy access to your labs results, and communication with your Primary care physician.    Please check-out at the front desk before leaving the clinic.   Take Care,   Back Injury Prevention Back injuries can be very painful. They can also be difficult to heal. After having one back injury, you are more likely to injure your back again. It is important to learn how to avoid injuring or re-injuring your back. The following tips can help you to prevent a back injury. WHAT SHOULD I KNOW ABOUT PHYSICAL FITNESS?  Exercise for 30 minutes per day on most days of the week or as told by your doctor. Make sure to:  Do aerobic exercises, such as walking, jogging, biking, or swimming.  Do exercises that increase balance and strength, such as tai chi and yoga.  Do stretching exercises. This helps with flexibility.  Try to develop strong belly (abdominal) muscles. Your belly muscles help to support your back.  Stay at a healthy weight. This helps to decrease your risk of a back  injury. WHAT SHOULD I KNOW ABOUT MY DIET?  Talk with your doctor about your overall diet. Take supplements and vitamins only as told by your doctor.  Talk with your doctor about how much calcium and vitamin D you need each day. These nutrients help to prevent weakening of the bones (osteoporosis).  Include good sources of calcium in your diet, such as:  Dairy products.  Green leafy vegetables.  Products that have had calcium added to them (fortified).  Include good sources of vitamin D in your diet, such as:  Milk.  Foods that have had vitamin D added to them. WHAT SHOULD I KNOW ABOUT MY POSTURE?  Sit up straight and stand up straight. Avoid leaning forward when you sit or hunching over when you stand.  Choose chairs that have good low-back (lumbar) support.  If you work at a desk, sit close to it so you do not need to lean over. Keep your chin tucked in. Keep your neck drawn back. Keep your elbows bent so your arms look like the letter "L" (right angle).  Sit high and close to the steering wheel when you drive. Add a low-back support to your car seat, if needed.  Avoid sitting or standing in one position for very long. Take breaks to get up, stretch, and walk around at least one time every hour. Take breaks every hour if you are driving for long periods of time.  Sleep on your  side with your knees slightly bent, or sleep on your back with a pillow under your knees. Do not lie on the front of your body to sleep. WHAT SHOULD I KNOW ABOUT LIFTING, TWISTING, AND REACHING Lifting and Heavy Lifting  Avoid heavy lifting, especially lifting over and over again. If you must do heavy lifting:  Stretch before lifting.  Work slowly.  Rest between lifts.  Use a tool such as a cart or a dolly to move objects if one is available.  Make several small trips instead of carrying one heavy load.  Ask for help when you need it, especially when moving big objects.  Follow these steps  when lifting:  Stand with your feet shoulder-width apart.  Get as close to the object as you can. Do not pick up a heavy object that is far from your body.  Use handles or lifting straps if they are available.  Bend at your knees. Squat down, but keep your heels off the floor.  Keep your shoulders back. Keep your chin tucked in. Keep your back straight.  Lift the object slowly while you tighten the muscles in your legs, belly, and butt. Keep the object as close to the center of your body as possible.  Follow these steps when putting down a heavy load:  Stand with your feet shoulder-width apart.  Lower the object slowly while you tighten the muscles in your legs, belly, and butt. Keep the object as close to the center of your body as possible.  Keep your shoulders back. Keep your chin tucked in. Keep your back straight.  Bend at your knees. Squat down, but keep your heels off the floor.  Use handles or lifting straps if they are available. Twisting and Reaching  Avoid lifting heavy objects above your waist.  Do not twist at your waist while you are lifting or carrying a load. If you need to turn, move your feet.  Do not bend over without bending at your knees.  Avoid reaching over your head, across a table, or for an object on a high surface.  WHAT ARE SOME OTHER TIPS?  Avoid wet floors and icy ground. Keep sidewalks clear of ice to prevent falls.   Do not sleep on a mattress that is too soft or too hard.   Keep items that you use often within easy reach.   Put heavier objects on shelves at waist level, and put lighter objects on lower or higher shelves.  Find ways to lower your stress, such as:  Exercise.  Massage.  Relaxation techniques.  Talk with your doctor if you feel anxious or depressed. These conditions can make back pain worse.  Wear flat heel shoes with cushioned soles.  Avoid making quick (sudden) movements.  Use both shoulder straps when  carrying a backpack.  Do not use any tobacco products, including cigarettes, chewing tobacco, or electronic cigarettes. If you need help quitting, ask your doctor.   This information is not intended to replace advice given to you by your health care provider. Make sure you discuss any questions you have with your health care provider.   Document Released: 02/22/2008 Document Revised: 01/20/2015 Document Reviewed: 09/09/2014 Elsevier Interactive Patient Education 2016 [employer].     Exercise Guide:

## 2016-01-21 NOTE — Assessment & Plan Note (Signed)
He is pain free now. No red flags on history and exam. Discussed some tips about back injury prevention and gave handouts

## 2016-01-21 NOTE — Assessment & Plan Note (Signed)
ASCVD risk 11%. Discussed this with the patient. However, he reports trying statins multiple time in the past and he couldn't tolerate them. So, we will continue his Zetia and lifestyle changes. I have discussed about diet an exercise with the patient, and gave him handout

## 2016-01-21 NOTE — Assessment & Plan Note (Signed)
Gave 6 months worth of doxycyline for malaria ppx.

## 2016-01-22 LAB — RPR

## 2016-01-22 LAB — HIV ANTIBODY (ROUTINE TESTING W REFLEX): HIV 1&2 Ab, 4th Generation: NONREACTIVE

## 2016-01-23 LAB — URINE CYTOLOGY ANCILLARY ONLY
Chlamydia: NEGATIVE
Neisseria Gonorrhea: NEGATIVE

## 2016-02-17 ENCOUNTER — Encounter: Payer: Self-pay | Admitting: Student

## 2016-02-28 ENCOUNTER — Encounter: Payer: Self-pay | Admitting: Student

## 2016-04-25 ENCOUNTER — Encounter: Payer: Self-pay | Admitting: Student

## 2016-04-27 ENCOUNTER — Encounter: Payer: Self-pay | Admitting: Student

## 2016-05-12 ENCOUNTER — Encounter: Payer: Self-pay | Admitting: Student

## 2016-05-12 ENCOUNTER — Ambulatory Visit (INDEPENDENT_AMBULATORY_CARE_PROVIDER_SITE_OTHER): Payer: 59 | Admitting: Student

## 2016-05-12 VITALS — BP 118/65 | HR 66 | Temp 97.5°F | Ht 70.5 in | Wt 185.6 lb

## 2016-05-12 DIAGNOSIS — N529 Male erectile dysfunction, unspecified: Secondary | ICD-10-CM | POA: Diagnosis not present

## 2016-05-12 DIAGNOSIS — E785 Hyperlipidemia, unspecified: Secondary | ICD-10-CM

## 2016-05-12 DIAGNOSIS — Z7251 High risk heterosexual behavior: Secondary | ICD-10-CM

## 2016-05-12 DIAGNOSIS — I10 Essential (primary) hypertension: Secondary | ICD-10-CM | POA: Diagnosis not present

## 2016-05-12 MED ORDER — EZETIMIBE 10 MG PO TABS: 10.0000 mg | ORAL_TABLET | Freq: Every day | ORAL | 1 refills | Status: DC

## 2016-05-12 MED ORDER — LISINOPRIL 20 MG PO TABS
20.0000 mg | ORAL_TABLET | Freq: Every day | ORAL | 1 refills | Status: DC
Start: 2016-05-12 — End: 2016-09-15

## 2016-05-12 NOTE — Progress Notes (Signed)
   Subjective:    Patient ID: Jared Holt, male    DOB: 01/18/1961, 55 y.o.   MRN: [id number]  HPI Here for annual exam.  #Headache and dizziness: for two months. He thought this was due to Cialis. However, he continued to have headache after stopping it. The headache has gone away for the last two weeks. He describes the dizziness as off balance like lightheadedness. He went to an Ear doctor and was given a lot of ear drops. His headache and dizziness improved with this.  He was on Doxycyline when he was in [place] but stopped taking it out of concern this may worsen in ED.   #ED: Viagra and Cialis are not working. Reports difficulty having good erection for successful penetration. Has one sexual partner. He has been with this sexual partner for two to three months. This was not that bad with previous sexual partner. Reports having early morning erection intermittently. Also reports pain and blisters on his penis. The blisters has gone but he still feels pain on his glance. Denies penile discharge or scrotal swelling  Review of Systems  Per HPI Objective:   Physical Exam Vitals:   05/12/16 1433  BP: 118/65  Pulse: 66  Temp: 97.5 F (36.4 C)  TempSrc: Oral  Weight: 185 lb 9.6 oz (84.2 kg)  Height: 5' 10.5" (1.791 m)   GEN: appears well, no apparent distress. RESP: no increased work of breathing GI: soft, non-tender, non-distended GU: no penile discharge or lesion, no scrotal swelling or tenderness. Testis equal size.  NEURO: alert and oriented appropriately, no gross defecits  PSYCH: appropriate mood and affect     Assessment & Plan:  Erectile dysfunction This is a chronic issue. History suggestive for psychological versus organic or hormonal. Has intermittent early morning or night time tumescence which makes organic causes less likely. He also doesn't have history of diabetes.  -We will check his morning testosterone levels. His future order for this. Patient to return for lab  next week.   Risky sexual behavior Reports new partner for the last 2-3 months. Denies using protection. He reports history of penile blisters and pain. Genitourinary exam within normal limits without any lesion of blisters. I have repeatedly discussed about the risks associated with his behavior and encouraged him to use protection consistently. Given his risk factors, I will obtain his HIV, GC/CT and RPR. I doubt the utility of HSV testing without lesion. Patient to return for this labs next week.   Headache and dizziness: Unclear etiology. Patient doesn't use his prophylactic medications during travel to [place]. Fortunately his symptoms have resolved for the last 2 weeks. Advised to take his doxycycline consistently for prophylaxis against malaria. No further tests for now.

## 2016-05-12 NOTE — Assessment & Plan Note (Signed)
Reports new partner for the last 2-3 months. Denies using protection. He reports history of penile blisters and pain. Genitourinary exam within normal limits without any lesion of blisters. I have repeatedly discussed about the risks associated with his behavior and encouraged him to use protection consistently. Given his risk factors, I will obtain his HIV, GC/CT and RPR. I doubt the utility of HSV testing without lesion. Patient to return for this labs next week.

## 2016-05-12 NOTE — Assessment & Plan Note (Signed)
This is a chronic issue. History suggestive for psychological versus organic or hormonal. Has intermittent early morning or night time tumescence which makes organic causes less likely. He also doesn't have history of diabetes.  -We will check his morning testosterone levels. His future order for this. Patient to return for lab next week.

## 2016-05-12 NOTE — Patient Instructions (Addendum)
It was great seeing you today! We have addressed the following issues today  1. Erectile dysfunction: Have ordered a blood test. I would like you to return for blood draw in the morning. The clinic opens at 8:30 am. Keep the appointment with urology.     If we did any lab work today, and the results require attention, either me or my nurse will get in touch with you. If everything is normal, you will get a letter in mail. If you don't hear from us in two weeks, please give us a call. Otherwise, I look forward to talking with you again at our next visit. If you have any questions or concerns before then, please call the clinic at [phone].  Please bring all your medications to every doctors visit   Sign up for My Chart to have easy access to your labs results, and communication with your Primary care physician.    Please check-out at the front desk before leaving the clinic.   Take Care,

## 2016-05-16 ENCOUNTER — Other Ambulatory Visit: Payer: 59

## 2016-05-16 ENCOUNTER — Encounter: Payer: Self-pay | Admitting: Student

## 2016-05-16 ENCOUNTER — Other Ambulatory Visit: Payer: Self-pay | Admitting: Student

## 2016-05-16 DIAGNOSIS — B54 Unspecified malaria: Secondary | ICD-10-CM

## 2016-05-16 DIAGNOSIS — Z7251 High risk heterosexual behavior: Secondary | ICD-10-CM

## 2016-05-16 DIAGNOSIS — N529 Male erectile dysfunction, unspecified: Secondary | ICD-10-CM

## 2016-05-16 LAB — HIV ANTIBODY (ROUTINE TESTING W REFLEX): HIV: NONREACTIVE

## 2016-05-17 ENCOUNTER — Other Ambulatory Visit (HOSPITAL_COMMUNITY)
Admission: RE | Admit: 2016-05-17 | Discharge: 2016-05-17 | Disposition: A | Discharge status: Home / Self Care | Payer: 59 | Source: Ambulatory Visit | Attending: Family Medicine | Admitting: Family Medicine

## 2016-05-17 ENCOUNTER — Other Ambulatory Visit: Payer: 59

## 2016-05-17 DIAGNOSIS — Z113 Encounter for screening for infections with a predominantly sexual mode of transmission: Secondary | ICD-10-CM | POA: Insufficient documentation

## 2016-05-17 DIAGNOSIS — Z7251 High risk heterosexual behavior: Secondary | ICD-10-CM

## 2016-05-17 DIAGNOSIS — N529 Male erectile dysfunction, unspecified: Secondary | ICD-10-CM

## 2016-05-17 LAB — RPR

## 2016-05-18 ENCOUNTER — Encounter: Payer: Self-pay | Admitting: Student

## 2016-05-18 LAB — URINE CYTOLOGY ANCILLARY ONLY
Chlamydia: NEGATIVE
Neisseria Gonorrhea: NEGATIVE
Trichomonas: NEGATIVE

## 2016-05-18 LAB — TESTOSTERONE TOTAL,FREE,BIO, MALES
ALBUMIN: 4.1 g/dL (ref 3.6–5.1)
SEX HORMONE BINDING: 41 nmol/L (ref 22–77)
TESTOSTERONE: 354 ng/dL (ref 250–827)
Testosterone, Bioavailable: 73.2 ng/dL — ABNORMAL LOW (ref 130.5–681.7)
Testosterone, Free: 38.9 pg/mL — ABNORMAL LOW (ref 47.0–244.0)

## 2016-05-20 ENCOUNTER — Ambulatory Visit (HOSPITAL_COMMUNITY)
Admission: EM | Admit: 2016-05-20 | Discharge: 2016-05-20 | Disposition: A | Discharge status: Home / Self Care | Payer: 59 | Attending: Physician Assistant | Admitting: Physician Assistant

## 2016-05-20 ENCOUNTER — Encounter (HOSPITAL_COMMUNITY): Payer: Self-pay | Admitting: Family Medicine

## 2016-05-20 DIAGNOSIS — N481 Balanitis: Secondary | ICD-10-CM | POA: Diagnosis not present

## 2016-05-20 MED ORDER — CLOTRIMAZOLE 1 % EX CREA: TOPICAL_CREAM | CUTANEOUS | 0 refills | Status: DC

## 2016-05-20 NOTE — Discharge Instructions (Signed)
Keep area clean and dry. Apply cream twice a day for up to 2 weeks. If not improving f/u with PCP

## 2016-05-20 NOTE — ED Provider Notes (Signed)
CSN: [id number]     Arrival date & time 05/20/16  1133 History   First MD Initiated Contact with Patient 05/20/16 1316     Chief Complaint  Patient presents with  . Groin Swelling   (Consider location/radiation/quality/duration/timing/severity/associated sxs/prior Treatment) Patient is a 55 yo male who presents with ongoing tenderness and "burning" to the head of his penis. He saw his PCP 4 days ago and was treated with Doxycycline. This has not helped his symptoms. He denies dysuria or hematuria. No penile drainage. No fever or chills.       Past Medical History:  Diagnosis Date  . High cholesterol   . Hypertension   . Weakness    History reviewed. No pertinent surgical history. Family History  Problem Relation Age of Onset  . Heart failure Father    Social History  Substance Use Topics  . Smoking status: Never Smoker  . Smokeless tobacco: Never Used  . Alcohol use Yes     Comment: social    Review of Systems  Constitutional: Negative for fever.  Gastrointestinal: Negative for abdominal pain.  Genitourinary: Positive for genital sores and penile pain. Negative for decreased urine volume, difficulty urinating, dysuria, enuresis, penile swelling, scrotal swelling, testicular pain and urgency.  Musculoskeletal: Negative for back pain.    Allergies  Sulfamethoxazole; Statins; and Sulfa antibiotics  Home Medications   Prior to Admission medications   Medication Sig Start Date End Date Taking? Authorizing Provider  clotrimazole (LOTRIMIN) 1 % cream Apply to affected area 2 times daily 05/20/16   Riki SheerMichelle G Toi Stelly, PA-C  doxycycline (VIBRA-TABS) 100 MG tablet TAKE ONE TABLET BY MOUTH ONCE DAILY, START 2  DAYS BEFORE TRAVEL AND CONTINUE FOR 4 WEEKS  AFTER TRAVEL. 05/16/16   Almon Herculesaye T Gonfa, MD  ezetimibe (ZETIA) 10 MG tablet Take 1 tablet (10 mg total) by mouth daily. 05/12/16   Almon Herculesaye T Gonfa, MD  lisinopril (PRINIVIL,ZESTRIL) 20 MG tablet Take 1 tablet (20 mg total) by mouth daily.  05/12/16   Almon Herculesaye T Gonfa, MD  naproxen (NAPROSYN) 500 MG tablet Take 1 tablet (500 mg total) by mouth 2 (two) times daily with a meal. 11/30/15   Beau FannyJohn C Withrow, FNP  neomycin-polymyxin-hydrocortisone (CORTISPORIN) 3.5-10000-1 otic suspension  01/18/16   Historical Provider, MD  tadalafil (CIALIS) 5 MG tablet Take 1 tablet (5 mg total) by mouth every other day as needed for erectile dysfunction. 01/21/16   Almon Herculesaye T Gonfa, MD   Meds Ordered and Administered this Visit  Medications - No data to display  BP 112/75   Pulse 60   Temp 98 F (36.7 C)   Resp 18   SpO2 98%  No data found.   Physical Exam  Constitutional: He is oriented to person, place, and time. He appears well-developed and well-nourished. No distress.  Genitourinary:  Genitourinary Comments: Very mild inflammation and erythematous glans on exam, remainder of exam normal. No drainage  Neurological: He is alert and oriented to person, place, and time.  Skin: Skin is warm and dry. He is not diaphoretic.  Psychiatric: His behavior is normal.  Nursing note and vitals reviewed.   Urgent Care Course   Clinical Course    Procedures (including critical care time)  Labs Review Labs Reviewed - No data to display  Imaging Review No results found.   Visual Acuity Review  Right Eye Distance:   Left Eye Distance:   Bilateral Distance:    Right Eye Near:   Left Eye Near:  Bilateral Near:         MDM   1. Balanitis    Prior work up negative (GC, Chy, RPR, HIV all normal). Glucose 74. Mild presentation. Treat with Lotrimin 1% bid. F/U with PCP if not improving.     Riki Sheer, PA-C 05/20/16 1352

## 2016-05-20 NOTE — ED Triage Notes (Signed)
Pt here for penile swelling and pain at the tip. sts that he is currently being treated for gonorrhea with doxycycline.

## 2016-05-21 ENCOUNTER — Other Ambulatory Visit: Payer: Self-pay | Admitting: Student

## 2016-05-21 ENCOUNTER — Encounter: Payer: Self-pay | Admitting: Student

## 2016-05-21 DIAGNOSIS — E291 Testicular hypofunction: Secondary | ICD-10-CM

## 2016-05-21 DIAGNOSIS — N529 Male erectile dysfunction, unspecified: Secondary | ICD-10-CM

## 2016-05-21 NOTE — Progress Notes (Signed)
Ordered future lab work for hypogonadism. His free and bioavailable testosterone level are low. Ordered repeat testosterone, FSH/LH, prolactin level, CMP, CBC and TSH. [facility]. Also sent mychart message to patient to see me in clinic.

## 2016-05-22 ENCOUNTER — Encounter: Payer: Self-pay | Admitting: Student

## 2016-09-10 ENCOUNTER — Other Ambulatory Visit: Payer: Self-pay | Admitting: Student

## 2016-09-10 DIAGNOSIS — B54 Unspecified malaria: Secondary | ICD-10-CM

## 2016-09-15 ENCOUNTER — Ambulatory Visit (INDEPENDENT_AMBULATORY_CARE_PROVIDER_SITE_OTHER): Payer: 59 | Admitting: Family Medicine

## 2016-09-15 ENCOUNTER — Encounter: Payer: Self-pay | Admitting: Family Medicine

## 2016-09-15 DIAGNOSIS — N529 Male erectile dysfunction, unspecified: Secondary | ICD-10-CM

## 2016-09-15 DIAGNOSIS — E785 Hyperlipidemia, unspecified: Secondary | ICD-10-CM | POA: Diagnosis not present

## 2016-09-15 DIAGNOSIS — I10 Essential (primary) hypertension: Secondary | ICD-10-CM | POA: Diagnosis not present

## 2016-09-15 MED ORDER — AZITHROMYCIN 250 MG PO TABS: ORAL_TABLET | ORAL | 0 refills | Status: DC

## 2016-09-15 MED ORDER — TADALAFIL 5 MG PO TABS: 5.0000 mg | ORAL_TABLET | ORAL | 0 refills | Status: DC | PRN

## 2016-09-15 MED ORDER — LORATADINE 10 MG PO TABS: 10.0000 mg | ORAL_TABLET | Freq: Every day | ORAL | 11 refills | Status: DC

## 2016-09-15 MED ORDER — LISINOPRIL 20 MG PO TABS: 20.0000 mg | ORAL_TABLET | Freq: Every day | ORAL | 1 refills | Status: DC

## 2016-09-15 MED ORDER — EZETIMIBE 10 MG PO TABS: 10.0000 mg | ORAL_TABLET | Freq: Every day | ORAL | 1 refills | Status: DC

## 2016-09-15 NOTE — Assessment & Plan Note (Signed)
Normotensive today. Since patient had episode of 160 systolic at home (asymptomatic), asked him to check BP daily with cuff at home and record value. He is leaving for [place] in a couple days and will be gone for 4-5 months. Will collect CBC, CMP, and lipid panel before he goes. Refills for medications sent to his pharmacy and paper prescriptions written as well per his request. Follow up with PCP.

## 2016-09-15 NOTE — Progress Notes (Signed)
   Subjective:    Patient ID: Jared Holt , male   DOB: 01/26/1961 , 55 y.o..   MRN: [id number]  HPI  Jared Holt is here for  Chief Complaint  Patient presents with  . Follow-up   Hypertension Blood pressure at home: Usually systolic 130's. Had 1 episode of 160's systolic at home on 09/12/16 Exercise: Tries to walk/run on the treadmill 30 mins 3 x a week Low salt diet: does not comply Medications: Compliant with Lisinopril 20 mg daily Side effects: None ROS: Denies headache, visual changes, nausea, vomiting, chest pain, abdominal pain or shortness of breath. Admits to random episodes of dizziness, never any syncope. Dizziness occurs when he goes from sitting to standing position to quickly. BP Readings from Last 3 Encounters:  09/15/16 132/82  05/20/16 112/75  05/12/16 118/65    Review of Systems: Per HPI. All other systems reviewed and are negative.  Past Medical History: Patient Active Problem List   Diagnosis Date Noted  . Risky sexual behavior 05/12/2016  . Thrombocytopenia (HCC) 09/08/2015  . Erectile dysfunction 09/08/2015  . Travel advice encounter 09/08/2015  . Routine health maintenance 09/08/2015  . Dysfunction of eustachian tube 10/08/2013  . Otalgia of right ear 10/08/2013  . LBP (low back pain) 09/10/2013  . HLD (hyperlipidemia) 04/15/2013  . BP (high blood pressure) 04/15/2013    Medications: reviewed and updated Current Outpatient Prescriptions  Medication Sig Dispense Refill  . clotrimazole (LOTRIMIN) 1 % cream Apply to affected area 2 times daily 15 g 0  . doxycycline (VIBRA-TABS) 100 MG tablet TAKE ONE TABLET BY MOUTH ONCE DAILY.  START 2 DAYS BEFORE TRAVEL AND CONTINUE FOR 4 WEEKS AFTER TRAVEL 120 tablet 0  . ezetimibe (ZETIA) 10 MG tablet Take 1 tablet (10 mg total) by mouth daily. 180 tablet 1  . lisinopril (PRINIVIL,ZESTRIL) 20 MG tablet Take 1 tablet (20 mg total) by mouth daily. 180 tablet 1  . naproxen (NAPROSYN) 500 MG tablet Take 1 tablet  (500 mg total) by mouth 2 (two) times daily with a meal. 14 tablet 0  . neomycin-polymyxin-hydrocortisone (CORTISPORIN) 3.5-10000-1 otic suspension     . tadalafil (CIALIS) 5 MG tablet Take 1 tablet (5 mg total) by mouth every other day as needed for erectile dysfunction. 40 tablet 0   No current facility-administered medications for this visit.     Social Hx:  reports that he has never smoked. He has never used smokeless tobacco.   Objective:   BP 132/82   Pulse (!) 58   Temp 97.9 F (36.6 C) (Oral)   Ht 5\' 11"  (1.803 m)   Wt 181 lb 6.4 oz (82.3 kg)   SpO2 96%   BMI 25.30 kg/m  Physical Exam  Gen: NAD, alert, cooperative with exam, well-appearing HEENT: NCAT, PERRL, clear conjunctiva Cardiac: Regular rate and rhythm, no edema, capillary refill brisk  Respiratory:  non-labored breathing Neurological: no gross deficits.  Psych: good insight, normal mood and affect  Assessment & Plan:  BP (high blood pressure) Normotensive today. Since patient had episode of 160 systolic at home (asymptomatic), asked him to check BP daily with cuff at home and record value. He is leaving for [place] in a couple days and will be gone for 4-5 months. Will collect CBC, CMP, and lipid panel before he goes. Refills for medications sent to his pharmacy and paper prescriptions written as well per his request. Follow up with PCP.   Anders Simmondshristina Ruthell Feigenbaum, MD [facility], PGY-2

## 2016-09-15 NOTE — Patient Instructions (Addendum)
Thank you for coming in today, it was so nice to see you! Today we talked about:    Blood pressure: Please check your pressure every day and record it in a notebook. If your blood pressure is higher than 140/90, please let us know. Continue taking your medications as prescribed.  Lab work, we have placed an order for lab work, please come in tomorrow to get this done.   Please follow up in 4-5 months when you return from [place].   If we ordered any tests today, you will be notified via telephone of any abnormalities. If everything is normal you will get a letter in the mail.   If you have any questions or concerns, please do not hesitate to call the office at [phone]. You can also message me directly via MyChart.   Sincerely,  Anders Simmondshristina Jonavin Seder, MD

## 2016-09-16 ENCOUNTER — Other Ambulatory Visit: Payer: 59

## 2016-09-16 DIAGNOSIS — E785 Hyperlipidemia, unspecified: Secondary | ICD-10-CM

## 2016-09-16 DIAGNOSIS — I10 Essential (primary) hypertension: Secondary | ICD-10-CM

## 2016-09-16 LAB — CBC WITH DIFFERENTIAL/PLATELET
Basophils Absolute: 38 cells/uL (ref 0–200)
Basophils Relative: 1 %
EOS ABS: 342 {cells}/uL (ref 15–500)
Eosinophils Relative: 9 %
HEMATOCRIT: 47.8 % (ref 38.5–50.0)
Hemoglobin: 15.6 g/dL (ref 13.2–17.1)
LYMPHS ABS: 2052 {cells}/uL (ref 850–3900)
LYMPHS PCT: 54 %
MCH: 29.8 pg (ref 27.0–33.0)
MCHC: 32.6 g/dL (ref 32.0–36.0)
MCV: 91.2 fL (ref 80.0–100.0)
MONO ABS: 380 {cells}/uL (ref 200–950)
MPV: 11.5 fL (ref 7.5–12.5)
Monocytes Relative: 10 %
NEUTROS ABS: 988 {cells}/uL — AB (ref 1500–7800)
Neutrophils Relative %: 26 %
Platelets: 151 10*3/uL (ref 140–400)
RBC: 5.24 MIL/uL (ref 4.20–5.80)
RDW: 14.4 % (ref 11.0–15.0)
WBC: 3.8 10*3/uL (ref 3.8–10.8)

## 2016-09-16 LAB — COMPREHENSIVE METABOLIC PANEL
ALBUMIN: 4.1 g/dL (ref 3.6–5.1)
ALT: 38 U/L (ref 9–46)
AST: 28 U/L (ref 10–35)
Alkaline Phosphatase: 47 U/L (ref 40–115)
BUN: 14 mg/dL (ref 7–25)
CHLORIDE: 105 mmol/L (ref 98–110)
CO2: 27 mmol/L (ref 20–31)
Calcium: 9.2 mg/dL (ref 8.6–10.3)
Creat: 1.13 mg/dL (ref 0.70–1.33)
Glucose, Bld: 95 mg/dL (ref 65–99)
POTASSIUM: 4.4 mmol/L (ref 3.5–5.3)
Sodium: 140 mmol/L (ref 135–146)
TOTAL PROTEIN: 6.8 g/dL (ref 6.1–8.1)
Total Bilirubin: 0.4 mg/dL (ref 0.2–1.2)

## 2016-09-16 LAB — LIPID PANEL
CHOL/HDL RATIO: 4.3 ratio (ref <5.0)
Cholesterol: 166 mg/dL (ref <200)
HDL: 39 mg/dL — AB (ref >40)
LDL CALC: 112 mg/dL — AB (ref <100)
TRIGLYCERIDES: 76 mg/dL (ref <150)
VLDL: 15 mg/dL (ref <30)

## 2016-09-22 ENCOUNTER — Encounter: Payer: Self-pay | Admitting: Family Medicine

## 2016-09-23 ENCOUNTER — Encounter: Payer: Self-pay | Admitting: Family Medicine

## 2016-10-06 ENCOUNTER — Encounter: Payer: Self-pay | Admitting: Student

## 2016-10-07 ENCOUNTER — Encounter: Payer: Self-pay | Admitting: Student

## 2016-10-07 DIAGNOSIS — D709 Neutropenia, unspecified: Secondary | ICD-10-CM | POA: Insufficient documentation

## 2016-11-22 ENCOUNTER — Other Ambulatory Visit: Payer: Self-pay | Admitting: Student

## 2016-11-22 ENCOUNTER — Ambulatory Visit
Admission: RE | Admit: 2016-11-22 | Discharge: 2016-11-22 | Disposition: A | Discharge status: Home / Self Care | Payer: 59 | Source: Ambulatory Visit | Attending: Family Medicine | Admitting: Family Medicine

## 2016-11-22 ENCOUNTER — Encounter: Payer: Self-pay | Admitting: Student

## 2016-11-22 ENCOUNTER — Ambulatory Visit (INDEPENDENT_AMBULATORY_CARE_PROVIDER_SITE_OTHER): Payer: 59 | Admitting: Student

## 2016-11-22 VITALS — BP 118/70 | HR 61 | Temp 98.1°F | Ht 71.0 in | Wt 190.0 lb

## 2016-11-22 DIAGNOSIS — G8929 Other chronic pain: Secondary | ICD-10-CM | POA: Diagnosis not present

## 2016-11-22 DIAGNOSIS — Z23 Encounter for immunization: Secondary | ICD-10-CM | POA: Diagnosis not present

## 2016-11-22 DIAGNOSIS — M25561 Pain in right knee: Principal | ICD-10-CM

## 2016-11-22 NOTE — Assessment & Plan Note (Addendum)
Likely due to OA. No signs of septic joint. Doubt inflammatory process without constitutional symptoms. No joint laxity to suggest ligamental injury. He denies history of trauma. Just had knee injection in [place] less than a month ago.   Recommend tylenol or ibuprofen as needed for pain  DG knee bilateral (AP standing, lateral and sun rise)

## 2016-11-22 NOTE — Patient Instructions (Addendum)
It was great seeing you today! We have addressed the following issues today 1. Right knee pain: this is likely due to osteoarthritis, change in knee joint that happen with age. I recommend taking Tylenol or ibuprofen as needed for pain. I have ordered an x-ray of your knees. You can have this done at imaging center. The address is :  Address: [address], [place], [place] [address]  Phone: [phone]  If we did any lab work today, and the results require attention, either me or my nurse will get in touch with you. If everything is normal, you will get a letter in mail and a message via . If you don't hear from us in two weeks, please give us a call. Otherwise, we look forward to seeing you again at your next visit. If you have any questions or concerns before then, please call the clinic at [phone].  Please bring all your medications to every doctors visit  Sign up for My Chart to have easy access to your labs results, and communication with your Primary care physician.    Please check-out at the front desk before leaving the clinic.    Take Care,   Dr. Alanda SlimGonfa

## 2016-11-22 NOTE — Progress Notes (Signed)
Subjective:    Jared Holt is a 56 y.o. old male here right knee pain  HPI Right Knee pain: has had pain on and off for over a year.  Worse over the last 1.5 months. Not sure about provoking factor. Describes the pain as dull. Sharp at times when he walks or when he is active. He says he feels like the whole right side is hurting from his head to toes when he has the right knee pain. Denies back pain. Denies history of trauma or surgical procedure.  He reports noting swelling when he saw Orthopedic doctor in [place]. He was told to have arthritis. He had right knee injection about a month ago. He doesn't remember the name of the medicine. He is taking chondro flex as well.  He says it is a pain medicine.  Denies skin redness, fever, chills, dysuria, penile discharge, eye pain or other joint pains or fatigue.  PMH/Problem List: has Dysfunction of eustachian tube; HLD (hyperlipidemia); BP (high blood pressure); LBP (low back pain); Otalgia of right ear; Thrombocytopenia (HCC); Erectile dysfunction; Travel advice encounter; Routine health maintenance; Risky sexual behavior; Neutropenia (HCC); and Anterior knee pain, right on his problem list.   has a past medical history of High cholesterol; Hypertension; and Weakness.  FH:  Family History  Problem Relation Age of Onset  . Heart failure Father     SH Social History  Substance Use Topics  . Smoking status: Never Smoker  . Smokeless tobacco: Never Used  . Alcohol use Yes     Comment: social    Review of Systems Review of systems negative except for pertinent positives and negatives in history of present illness above.     Objective:     Vitals:   11/22/16 1418  BP: 118/70  Pulse: 61  Temp: 98.1 F (36.7 C)  TempSrc: Oral  SpO2: 98%  Weight: 190 lb (86.2 kg)  Height: 5\' 11"  (1.803 m)    Physical Exam GEN: appears well, no apparent distress. CVS: RRR, nl S1&S2, no murmurs, no edema RESP: speaks in full sentence, no  IWOB MSK:  Right knee: No apparent swelling or skin erythema.  Both knees appears equal. Some tenderness to palpation over the medial aspect of right knee mainly over joint line. Full ROM (extension/flexion). Limited internal and external rotation. Negative Anterior/Posterior Drawer signs, Lachman's, Varus and Valgus tests. No effusion, bulge/balloon sign. Motor 5/5 in all muscle groups of LE. Light sensation intact. Patellar reflexes 1+ bilaterally.  SKIN: no apparent skin lesion NEURO: alert and oiented appropriately, no gross defecits  PSYCH: euthymic mood with congruent affect    Assessment and Plan:  Anterior knee pain, right Likely due to OA. No signs of septic joint. Doubt inflammatory process without constitutional symptoms.   Recommend tylenol or ibuprofen as needed for pain  DG knee bilateral (AP standing, lateral and sun rise)     Orders Placed This Encounter  Procedures  . DG Knee Bilateral Standing AP    Please include lateral and sun rise    Standing Status:   Future    Number of Occurrences:   1    Standing Expiration Date:   01/22/2018    Order Specific Question:   Reason for Exam (SYMPTOM  OR DIAGNOSIS REQUIRED)    Answer:   right knee pain    Order Specific Question:   Preferred imaging location?    Answer:   [facility]  . Flu Vaccine QUAD 36+ mos IM  Return if symptoms worsen or fail to improve.  Almon Herculesaye T Gonfa, MD 11/22/16 Pager: [phone]

## 2016-11-23 ENCOUNTER — Telehealth: Payer: Self-pay | Admitting: Student

## 2016-11-23 NOTE — Telephone Encounter (Signed)
Called and discussed X-ray finding of his knees with the patient. Will continue conservative management with OTC pain meds and gradual increase in his activities as tolerated.

## 2016-11-24 ENCOUNTER — Encounter: Payer: Self-pay | Admitting: Student

## 2016-11-26 ENCOUNTER — Encounter: Payer: Self-pay | Admitting: Student

## 2017-01-29 ENCOUNTER — Encounter: Payer: Self-pay | Admitting: Student

## 2017-02-01 ENCOUNTER — Encounter: Payer: Self-pay | Admitting: Student

## 2017-02-05 ENCOUNTER — Other Ambulatory Visit: Payer: Self-pay | Admitting: Student

## 2017-02-05 DIAGNOSIS — B54 Unspecified malaria: Secondary | ICD-10-CM

## 2017-02-05 MED ORDER — DOXYCYCLINE HYCLATE 100 MG PO CAPS: 100.0000 mg | ORAL_CAPSULE | Freq: Every day | ORAL | 2 refills | Status: DC

## 2017-03-14 ENCOUNTER — Encounter: Payer: Self-pay | Admitting: Student

## 2017-03-16 ENCOUNTER — Other Ambulatory Visit: Payer: Self-pay | Admitting: Student

## 2017-03-16 DIAGNOSIS — M170 Bilateral primary osteoarthritis of knee: Secondary | ICD-10-CM

## 2017-04-03 ENCOUNTER — Ambulatory Visit (INDEPENDENT_AMBULATORY_CARE_PROVIDER_SITE_OTHER): Payer: 59 | Admitting: Orthopaedic Surgery

## 2017-04-03 DIAGNOSIS — M1711 Unilateral primary osteoarthritis, right knee: Secondary | ICD-10-CM | POA: Diagnosis not present

## 2017-04-03 DIAGNOSIS — M1712 Unilateral primary osteoarthritis, left knee: Secondary | ICD-10-CM | POA: Diagnosis not present

## 2017-04-03 NOTE — Progress Notes (Signed)
Office Visit Note   Patient: Jared Holt           Date of Birth: 10/09/1960           MRN: [id number] Visit Date: 04/03/2017              Requested by: Almon HerculesGonfa, Taye T, MD [address] [place], [place] [address] PCP: Almon HerculesGonfa, Taye T, MD   Assessment & Plan: Visit Diagnoses:  1. Unilateral primary osteoarthritis, left knee   2. Unilateral primary osteoarthritis, right knee     Plan: Bilateral knee MRIs ordered to rule out structural abnormality given failure of conservative treatment. Follow-up after the MRI.  Follow-Up Instructions: Return if symptoms worsen or fail to improve.   Orders:  Orders Placed This Encounter  Procedures  . MR Knee Left w/o contrast  . MR Knee Right w/o contrast   No orders of the defined types were placed in this encounter.     Procedures: No procedures performed   Clinical Data: No additional findings.   Subjective: No chief complaint on file.   Jared Holt is a 56 year old gentleman with chronic right greater than left knee pain for several years. He has had several cortisone injections with temporary relief. He is also had hyaluronic acid injections with longer relief but still temporary. He continues to have swelling. He has used an over-the-counter brace without any significant relief. He denies any numbness or tingling. Denies constitutional symptoms.    Review of Systems  Constitutional: Negative.   All other systems reviewed and are negative.    Objective: Vital Signs: There were no vitals taken for this visit.  Physical Exam  Constitutional: He is oriented to person, place, and time. He appears well-developed and well-nourished.  HENT:  Head: Normocephalic and atraumatic.  Eyes: Pupils are equal, round, and reactive to light.  Neck: Neck supple.  Pulmonary/Chest: Effort normal.  Abdominal: Soft.  Musculoskeletal: Normal range of motion.  Neurological: He is alert and oriented to person, place, and time.  Skin: Skin is  warm.  Psychiatric: He has a normal mood and affect. His behavior is normal. Judgment and thought content normal.  Nursing note and vitals reviewed.   Ortho Exam Bilateral knee exam shows no significant effusion. Collaterals and cruciates are stable. Normal temperature. No joint line tenderness. Specialty Comments:  No specialty comments available.  Imaging: No results found.   PMFS History: Patient Active Problem List   Diagnosis Date Noted  . Anterior knee pain, right 11/22/2016  . Neutropenia (HCC) 10/07/2016  . Risky sexual behavior 05/12/2016  . Thrombocytopenia (HCC) 09/08/2015  . Erectile dysfunction 09/08/2015  . Travel advice encounter 09/08/2015  . Routine health maintenance 09/08/2015  . Dysfunction of eustachian tube 10/08/2013  . Otalgia of right ear 10/08/2013  . LBP (low back pain) 09/10/2013  . HLD (hyperlipidemia) 04/15/2013  . BP (high blood pressure) 04/15/2013   Past Medical History:  Diagnosis Date  . High cholesterol   . Hypertension   . Weakness     Family History  Problem Relation Age of Onset  . Heart failure Father     No past surgical history on file. Social History   Occupational History  . Not on file.   Social History Main Topics  . Smoking status: Never Smoker  . Smokeless tobacco: Never Used  . Alcohol use Yes     Comment: social  . Drug use: No  . Sexual activity: Not on file

## 2017-04-04 ENCOUNTER — Encounter: Payer: Self-pay | Admitting: Student

## 2017-04-04 ENCOUNTER — Ambulatory Visit (INDEPENDENT_AMBULATORY_CARE_PROVIDER_SITE_OTHER): Payer: 59 | Admitting: Student

## 2017-04-04 VITALS — BP 112/58 | HR 69 | Temp 98.4°F | Ht 71.0 in | Wt 181.0 lb

## 2017-04-04 DIAGNOSIS — R748 Abnormal levels of other serum enzymes: Secondary | ICD-10-CM | POA: Insufficient documentation

## 2017-04-04 DIAGNOSIS — M25561 Pain in right knee: Secondary | ICD-10-CM

## 2017-04-04 DIAGNOSIS — E785 Hyperlipidemia, unspecified: Secondary | ICD-10-CM

## 2017-04-04 DIAGNOSIS — I10 Essential (primary) hypertension: Secondary | ICD-10-CM | POA: Diagnosis not present

## 2017-04-04 DIAGNOSIS — G8929 Other chronic pain: Secondary | ICD-10-CM | POA: Diagnosis not present

## 2017-04-04 DIAGNOSIS — R1013 Epigastric pain: Secondary | ICD-10-CM

## 2017-04-04 MED ORDER — PRAVASTATIN SODIUM 40 MG PO TABS: 40.0000 mg | ORAL_TABLET | Freq: Every day | ORAL | 3 refills | Status: DC

## 2017-04-04 MED ORDER — LISINOPRIL 20 MG PO TABS: 20.0000 mg | ORAL_TABLET | Freq: Every day | ORAL | 1 refills | Status: DC

## 2017-04-04 MED ORDER — PANTOPRAZOLE SODIUM 40 MG PO TBEC: 40.0000 mg | DELAYED_RELEASE_TABLET | Freq: Every day | ORAL | 1 refills | Status: DC

## 2017-04-04 MED ORDER — DICLOFENAC SODIUM 1 % TD GEL: 2.0000 g | Freq: Four times a day (QID) | TRANSDERMAL | 3 refills | Status: DC

## 2017-04-04 NOTE — Assessment & Plan Note (Signed)
Gave Rx for Voltaren gel. He is followed by orthopedics. MRI scheduled for tomorrow

## 2017-04-04 NOTE — Assessment & Plan Note (Signed)
Started pravastatin. He didn't tolerated other statins in the past.

## 2017-04-04 NOTE — Assessment & Plan Note (Addendum)
Wonder if this is medication adverse effect. Denies recent illness. Advised to stop OTC medications/supplments without taking to his providers . Will check CMP and hepatitis panel today. If abnormal will obtain US +/- referral to GI.

## 2017-04-04 NOTE — Assessment & Plan Note (Signed)
He is tender to palpation over epigastric area concerning for peptic ulcer in the setting of excessive NSAID use. He denies melena or blood in stool. Advised to stop systemic NSAID. Gave Rx for Protonix. Voltaren gel for his knee.

## 2017-04-04 NOTE — Patient Instructions (Addendum)
It was great seeing you today! We have addressed the following issues today 1. Elevated liver enzyme: We are doing more tests today. If your tests are abnormal, someone will get in touch with you to discuss. Meanwhile, I strongly recommend you stop taking over-the-counter supplements or medications without talking to your doctors.  2.   Knee pain: I have sent a prescription for Voltaren gel to your pharmacy. Follow up with the orthopedics. 3.   Cholesterol: we have started you on pravastatin. Let us know if you have trouble taking this medication. We will check your cholesterol in 6-8 months 4.   Stomach pain: I have sent a prescription for pantoprazole to your pharmacy. Take this medication early in the morning about 30 minutes before breakfast.  5.   Blood pressure: We'll refilled your lisinopril today.   If we did any lab work today, and the results require attention, either me or my nurse will get in touch with you. If everything is normal, you will get a letter in mail and a message via . If you don't hear from us in two weeks, please give us a call. Otherwise, we look forward to seeing you again at your next visit. If you have any questions or concerns before then, please call the clinic at [phone].  Please bring all your medications to every doctors visit  Sign up for My Chart to have easy access to your labs results, and communication with your Primary care physician.    Please check-out at the front desk before leaving the clinic.    Take Care,   Dr. Alanda SlimGonfa

## 2017-04-04 NOTE — Progress Notes (Signed)
Subjective:    Jared Holt is a 56 y.o. old male here for elevated liver enzymes, knee pain and epigastric pain   HPI Elevated liver enzymes: had LFT while in [place] which showed elevated ALT to 128 and AST to 55. Denies recent illness, abdominal pain, emesis or diarrhea. He reports history of malaria in [place]. He is also on Doxycycline for prophylaxis. Denies drinking. Reports taking tylenol 500 mg twice a day for right knee pain. He reports taking red yeast rice for cholesterol and three other medications he bought online: Tilcotil (NSAID), Fitoform (wide variety of ingredients) and cleptol gel, all for his knee pain.   Right Knee pain: this is a chronic issue. He was referred to orthopedics. He is scheduled for MRI tomorrow.   Epigastric pain: this is after taking multiple NSAIDs for his knee pain. He has stopped taking NSAIDs now. He denies melena or blood in stool. Admits nausea with this but no emesis. Denies unintentional weight loss.  PMH/Problem List: has Dysfunction of eustachian tube; HLD (hyperlipidemia); BP (high blood pressure); LBP (low back pain); Otalgia of right ear; Thrombocytopenia ([place]); Erectile dysfunction; Travel advice encounter; Routine health maintenance; Risky sexual behavior; Neutropenia ([place]); Anterior knee pain, right; Elevated liver enzymes; and Abdominal pain, epigastric on his problem list.   has a past medical history of High cholesterol; Hypertension; and Weakness.  FH:  Family History  Problem Relation Age of Onset  . Heart failure Father     SH Social History  Substance Use Topics  . Smoking status: Never Smoker  . Smokeless tobacco: Never Used  . Alcohol use Yes     Comment: social    Review of Systems Review of systems negative except for pertinent positives and negatives in history of present illness above.     Objective:     Vitals:   04/04/17 1414  BP: (!) 112/58  Pulse: 69  Temp: 98.4 F (36.9 C)  TempSrc: Oral  SpO2: 97%  Weight:  181 lb (82.1 kg)  Height: 5' 11"  (1.803 m)    Physical Exam GEN: appears well, no apparent distress. Eyes: conjunctiva without injection, sclera anicteric Oropharynx: mmm without erythema or exudation HEM: negative for cervical or periauricular lymphadenopathies CVS: RRR, nl S1&S2, no murmurs, no edema RESP:  good air movement bilaterally, CTAB GI: BS present & normal, soft, tender to palpation over epigastric area. No mass or HSM MSK:   Right knee: tenderness to palpation over joint lines, crepitus, no effusion or erythema.  SKIN: no apparent skin lesion NEURO: alert and oiented appropriately, no gross defecits  PSYCH: euthymic mood with congruent affect    Assessment and Plan:  Elevated liver enzymes Wonder if this is medication adverse effect. Denies recent illness. Advised to stop OTC medications/supplments without taking to his providers . Will check CMP and hepatitis panel today. If abnormal will obtain US +/- referral to GI.  Anterior knee pain, right Gave Rx for Voltaren gel. He is followed by orthopedics. MRI scheduled for tomorrow  Abdominal pain, epigastric He is tender to palpation over epigastric area concerning for peptic ulcer in the setting of excessive NSAID use. He denies melena or blood in stool. Advised to stop systemic NSAID. Gave Rx for Protonix. Voltaren gel for his knee.   HLD (hyperlipidemia) Started pravastatin. He didn't tolerated other statins in the past.   Orders Placed This Encounter  Procedures  . Hepatitis B core antibody, IgM  . Hepatitis A antibody, IgM  . Hepatitis C antibody  .  CMP14+EGFR  . Hepatitis B core antibody, total  . Hepatitis B surface antigen  . Hepatitis B surface antibody   Meds ordered this encounter  Medications  . diclofenac sodium (VOLTAREN) 1 % GEL    Sig: Apply 2 g topically 4 (four) times daily.    Dispense:  400 g    Refill:  3  . pravastatin (PRAVACHOL) 40 MG tablet    Sig: Take 1 tablet (40 mg total) by  mouth daily.    Dispense:  90 tablet    Refill:  3  . pantoprazole (PROTONIX) 40 MG tablet    Sig: Take 1 tablet (40 mg total) by mouth daily.    Dispense:  90 tablet    Refill:  1  . lisinopril (PRINIVIL,ZESTRIL) 20 MG tablet    Sig: Take 1 tablet (20 mg total) by mouth daily.    Dispense:  180 tablet    Refill:  1   Return in about 6 months (around 10/05/2017) for Annual physical.  Mercy Riding, MD 04/04/17 Pager: [phone]

## 2017-04-05 ENCOUNTER — Ambulatory Visit
Admission: RE | Admit: 2017-04-05 | Discharge: 2017-04-05 | Disposition: A | Discharge status: Home / Self Care | Payer: 59 | Source: Ambulatory Visit | Attending: Orthopaedic Surgery | Admitting: Orthopaedic Surgery

## 2017-04-05 DIAGNOSIS — M1712 Unilateral primary osteoarthritis, left knee: Secondary | ICD-10-CM

## 2017-04-05 DIAGNOSIS — M1711 Unilateral primary osteoarthritis, right knee: Secondary | ICD-10-CM

## 2017-04-06 LAB — CMP14+EGFR
ALT: 33 IU/L (ref 0–44)
AST: 29 IU/L (ref 0–40)
Albumin/Globulin Ratio: 1.7 (ref 1.2–2.2)
Albumin: 4.3 g/dL (ref 3.5–5.5)
Alkaline Phosphatase: 75 IU/L (ref 39–117)
BILIRUBIN TOTAL: 0.2 mg/dL (ref 0.0–1.2)
BUN/Creatinine Ratio: 15 (ref 9–20)
BUN: 18 mg/dL (ref 6–24)
CALCIUM: 9.6 mg/dL (ref 8.7–10.2)
CHLORIDE: 97 mmol/L (ref 96–106)
CO2: 24 mmol/L (ref 20–29)
Creatinine, Ser: 1.18 mg/dL (ref 0.76–1.27)
GFR calc Af Amer: 79 mL/min/{1.73_m2} (ref >59)
GFR, EST NON AFRICAN AMERICAN: 68 mL/min/{1.73_m2} (ref >59)
Globulin, Total: 2.5 g/dL (ref 1.5–4.5)
Glucose: 80 mg/dL (ref 65–99)
POTASSIUM: 4.3 mmol/L (ref 3.5–5.2)
Sodium: 137 mmol/L (ref 134–144)
Total Protein: 6.8 g/dL (ref 6.0–8.5)

## 2017-04-06 LAB — HEPATITIS B CORE ANTIBODY, TOTAL: HEP B C TOTAL AB: NEGATIVE

## 2017-04-06 LAB — HEPATITIS A ANTIBODY, IGM: HEP A IGM: NEGATIVE

## 2017-04-06 LAB — HEPATITIS B SURFACE ANTIGEN: HEP B S AG: NEGATIVE

## 2017-04-06 LAB — HEPATITIS C ANTIBODY

## 2017-04-06 LAB — HEPATITIS B SURFACE ANTIBODY,QUALITATIVE: HEP B SURFACE AB, QUAL: NONREACTIVE

## 2017-04-06 LAB — HEPATITIS B CORE ANTIBODY, IGM: Hep B C IgM: NEGATIVE

## 2017-04-07 ENCOUNTER — Ambulatory Visit (INDEPENDENT_AMBULATORY_CARE_PROVIDER_SITE_OTHER): Payer: 59 | Admitting: Orthopaedic Surgery

## 2017-04-07 ENCOUNTER — Encounter (INDEPENDENT_AMBULATORY_CARE_PROVIDER_SITE_OTHER): Payer: Self-pay | Admitting: Orthopaedic Surgery

## 2017-04-07 DIAGNOSIS — M1711 Unilateral primary osteoarthritis, right knee: Secondary | ICD-10-CM | POA: Diagnosis not present

## 2017-04-07 DIAGNOSIS — M1712 Unilateral primary osteoarthritis, left knee: Secondary | ICD-10-CM | POA: Diagnosis not present

## 2017-04-07 MED ORDER — METHYLPREDNISOLONE ACETATE 40 MG/ML IJ SUSP
40.0000 mg | INTRAMUSCULAR | Status: AC | PRN
  Administered 2017-04-07: 40 mg via INTRA_ARTICULAR

## 2017-04-07 MED ORDER — LIDOCAINE HCL 1 % IJ SOLN
2.0000 mL | INTRAMUSCULAR | Status: AC | PRN
  Administered 2017-04-07: 2 mL

## 2017-04-07 MED ORDER — BUPIVACAINE HCL 0.5 % IJ SOLN
2.0000 mL | INTRAMUSCULAR | Status: AC | PRN
  Administered 2017-04-07: 2 mL via INTRA_ARTICULAR

## 2017-04-07 NOTE — Progress Notes (Signed)
Office Visit Note   Patient: Jared Holt           Date of Birth: 1961-01-14           MRN: [id number] Visit Date: 04/07/2017              Requested by: Jared Hercules, MD [address] [place], [place] [address] PCP: Jared Hercules, MD   Assessment & Plan: Visit Diagnoses:  1. Unilateral primary osteoarthritis, right knee   2. Unilateral primary osteoarthritis, left knee     Plan: Bilateral cortisone injections were performed in his knees. Pamphlet on Monopril and this was provided today. Questions encouraged and answered. MRI shows predominantly degenerative changes mainly in the medial and the patellofemoral compartments.  Follow-Up Instructions: Return if symptoms worsen or fail to improve.   Orders:  No orders of the defined types were placed in this encounter.  No orders of the defined types were placed in this encounter.     Procedures: Large Joint Inj Date/Time: 04/07/2017 10:32 AM Performed by: Jared Holt Authorized by: Jared Holt   Consent Given by:  Patient Timeout: prior to procedure the correct patient, procedure, and site was verified   Indications:  Pain Location:  Knee Site:  R knee Prep: patient was prepped and draped in usual sterile fashion   Needle Size:  22 G Ultrasound Guidance: No   Fluoroscopic Guidance: No   Arthrogram: No   Patient tolerance:  Patient tolerated the procedure well with no immediate complications Large Joint Inj Date/Time: 04/07/2017 10:32 AM Performed by: Jared Holt Authorized by: Jared Holt   Consent Given by:  Patient Timeout: prior to procedure the correct patient, procedure, and site was verified   Indications:  Pain Location:  Knee Site:  L knee Prep: patient was prepped and draped in usual sterile fashion   Needle Size:  22 G Ultrasound Guidance: No   Fluoroscopic Guidance: No   Arthrogram: No   Medications:  2 mL lidocaine 1 %; 2 mL bupivacaine 0.5 %; 40 mg methylPREDNISolone acetate 40  MG/ML Patient tolerance:  Patient tolerated the procedure well with no immediate complications     Clinical Data: No additional findings.   Subjective: Chief Complaint  Patient presents with  . Left Knee - Pain  . Right Knee - Pain    Jared Holt follows up today to review his MRIs. He's had previous cortisone injections with temporary relief. He is leaving for [place] for 2 months tomorrow.  he is interested in in another cortisone injection.    Review of Systems   Objective: Vital Signs: There were no vitals taken for this visit.  Physical Exam  Ortho Exam Bilateral knee exam is stable. Specialty Comments:  No specialty comments available.  Imaging: No results found.   PMFS History: Patient Active Problem List   Diagnosis Date Noted  . Elevated liver enzymes 04/04/2017  . Abdominal pain, epigastric 04/04/2017  . Anterior knee pain, right 11/22/2016  . Neutropenia (HCC) 10/07/2016  . Risky sexual behavior 05/12/2016  . Thrombocytopenia (HCC) 09/08/2015  . Erectile dysfunction 09/08/2015  . Travel advice encounter 09/08/2015  . Routine health maintenance 09/08/2015  . Dysfunction of eustachian tube 10/08/2013  . Otalgia of right ear 10/08/2013  . LBP (low back pain) 09/10/2013  . HLD (hyperlipidemia) 04/15/2013  . BP (high blood pressure) 04/15/2013   Past Medical History:  Diagnosis Date  . High cholesterol   . Hypertension   . Weakness  Family History  Problem Relation Age of Onset  . Heart failure Father     No past surgical history on file. Social History   Occupational History  . Not on file.   Social History Main Topics  . Smoking status: Never Smoker  . Smokeless tobacco: Never Used  . Alcohol use Yes     Comment: social  . Drug use: No  . Sexual activity: Not on file

## 2017-05-03 ENCOUNTER — Other Ambulatory Visit: Payer: Self-pay | Admitting: *Deleted

## 2017-05-03 DIAGNOSIS — G8929 Other chronic pain: Secondary | ICD-10-CM

## 2017-05-03 DIAGNOSIS — M25561 Pain in right knee: Principal | ICD-10-CM

## 2017-05-03 MED ORDER — DICLOFENAC SODIUM 1 % TD GEL: 2.0000 g | Freq: Four times a day (QID) | TRANSDERMAL | 3 refills | Status: DC

## 2017-05-15 ENCOUNTER — Other Ambulatory Visit: Payer: Self-pay | Admitting: *Deleted

## 2017-05-15 DIAGNOSIS — N529 Male erectile dysfunction, unspecified: Secondary | ICD-10-CM

## 2017-05-15 MED ORDER — TADALAFIL 5 MG PO TABS: 5.0000 mg | ORAL_TABLET | ORAL | 0 refills | Status: DC | PRN

## 2017-05-17 ENCOUNTER — Telehealth: Payer: Self-pay | Admitting: *Deleted

## 2017-05-17 NOTE — Telephone Encounter (Signed)
Prior Authorization received from [facility] pharmacy for Cialis 5 mg.  PA form placed in provider box for completion. Clovis PuMartin, Tamika L, RN

## 2017-05-18 NOTE — Telephone Encounter (Signed)
PA faxed to [facility] for review.  Dajae Kizer L, RN  

## 2017-05-18 NOTE — Telephone Encounter (Signed)
Reviewed, completed, and signed PA form.  Note routed to RN team inbasket and placed completed form in Clinic RN's office (wall pocket above desk).  Almon Herculesaye T Gonfa, MD

## 2017-05-30 NOTE — Telephone Encounter (Signed)
Called [facility] to check status of PA for Cialis. PA was denied for diagnosis of ED.  The quantity of 3 tablets per month is only allowed for ED.  In order for patient to get quantity of 30 tablets per month patient must have diagnosis of BPH.  Clovis PuMartin, Tamika L, RN

## 2017-06-12 ENCOUNTER — Other Ambulatory Visit: Payer: Self-pay | Admitting: Student

## 2017-06-12 ENCOUNTER — Encounter: Payer: Self-pay | Admitting: Student

## 2017-06-12 DIAGNOSIS — N529 Male erectile dysfunction, unspecified: Secondary | ICD-10-CM

## 2017-06-12 DIAGNOSIS — E785 Hyperlipidemia, unspecified: Secondary | ICD-10-CM

## 2017-06-12 MED ORDER — PRAVASTATIN SODIUM 40 MG PO TABS: 40.0000 mg | ORAL_TABLET | Freq: Every day | ORAL | 3 refills | Status: DC

## 2017-06-15 ENCOUNTER — Other Ambulatory Visit: Payer: Self-pay | Admitting: Student

## 2017-06-15 ENCOUNTER — Encounter: Payer: Self-pay | Admitting: Student

## 2017-06-15 DIAGNOSIS — B54 Unspecified malaria: Secondary | ICD-10-CM

## 2017-06-15 DIAGNOSIS — E785 Hyperlipidemia, unspecified: Secondary | ICD-10-CM

## 2017-06-15 DIAGNOSIS — N529 Male erectile dysfunction, unspecified: Secondary | ICD-10-CM

## 2017-06-15 MED ORDER — PRAVASTATIN SODIUM 40 MG PO TABS: 40.0000 mg | ORAL_TABLET | Freq: Every day | ORAL | 3 refills | Status: DC

## 2017-07-31 ENCOUNTER — Other Ambulatory Visit: Payer: Self-pay | Admitting: Student

## 2017-07-31 DIAGNOSIS — E785 Hyperlipidemia, unspecified: Secondary | ICD-10-CM

## 2017-07-31 DIAGNOSIS — I10 Essential (primary) hypertension: Secondary | ICD-10-CM

## 2017-07-31 MED ORDER — EZETIMIBE 10 MG PO TABS: 10.0000 mg | ORAL_TABLET | Freq: Every day | ORAL | 1 refills | Status: DC

## 2017-07-31 MED ORDER — LISINOPRIL 20 MG PO TABS: 20.0000 mg | ORAL_TABLET | Freq: Every day | ORAL | 1 refills | Status: DC

## 2017-07-31 NOTE — Progress Notes (Signed)
Zetia and lisinopril refilled. Sent to [facility]

## 2017-08-07 ENCOUNTER — Other Ambulatory Visit: Payer: Self-pay | Admitting: Student

## 2017-08-07 DIAGNOSIS — M25561 Pain in right knee: Principal | ICD-10-CM

## 2017-08-07 DIAGNOSIS — B54 Unspecified malaria: Secondary | ICD-10-CM

## 2017-08-07 DIAGNOSIS — G8929 Other chronic pain: Secondary | ICD-10-CM

## 2017-08-27 ENCOUNTER — Encounter: Payer: Self-pay | Admitting: Student

## 2017-09-03 ENCOUNTER — Other Ambulatory Visit: Payer: Self-pay

## 2017-09-03 ENCOUNTER — Encounter (HOSPITAL_COMMUNITY): Payer: Self-pay | Admitting: Physician Assistant

## 2017-09-03 ENCOUNTER — Ambulatory Visit (HOSPITAL_COMMUNITY)
Admission: EM | Admit: 2017-09-03 | Discharge: 2017-09-03 | Disposition: A | Discharge status: Home / Self Care | Payer: 59 | Attending: Internal Medicine | Admitting: Internal Medicine

## 2017-09-03 DIAGNOSIS — S8001XA Contusion of right knee, initial encounter: Secondary | ICD-10-CM

## 2017-09-03 NOTE — ED Triage Notes (Addendum)
Issue with rt knee, per pt pt is looks like a different color from the rest of his body, per pt he had not fallen recently,  Per pt he took Advil this morning and used Voltaren gel on his right knee

## 2017-09-03 NOTE — Discharge Instructions (Signed)
Discoloration most likely due to bruising from knee injection. Warm compresses to help with symptoms. Follow up with PCP after you finish antibiotics to retest for resolution of chlamydia. PCP to recheck skin area for further evaluation if not improving.

## 2017-09-03 NOTE — ED Provider Notes (Signed)
[facility]    CSN: [id number] Arrival date & time: 09/03/17  1503     History   Chief Complaint Chief Complaint  Patient presents with  . Knee Pain    HPI Jared Holt is a 56 y.o. male.   56 year old male with history of HLD, HTN, thrombocytopenia, knee arthritis comes in for discoloration of the right knee. States that he was out of the country recently, where he got a right knee injection from the superior lateral side of the knee. He noticed discoloration starting 2 days ago. Denies tenderness, erythema, increased warmth. Denies fever, chills, night sweats. Denies swelling of the knee. He states he is currently taking doxycycline for malaria prevention and chlamydia treatment and has 2 days left of medication.       Past Medical History:  Diagnosis Date  . High cholesterol   . Hypertension   . Weakness     Patient Active Problem List   Diagnosis Date Noted  . Elevated liver enzymes 04/04/2017  . Abdominal pain, epigastric 04/04/2017  . Anterior knee pain, right 11/22/2016  . Neutropenia (HCC) 10/07/2016  . Risky sexual behavior 05/12/2016  . Thrombocytopenia (HCC) 09/08/2015  . Erectile dysfunction 09/08/2015  . Travel advice encounter 09/08/2015  . Routine health maintenance 09/08/2015  . Dysfunction of eustachian tube 10/08/2013  . Otalgia of right ear 10/08/2013  . LBP (low back pain) 09/10/2013  . HLD (hyperlipidemia) 04/15/2013  . BP (high blood pressure) 04/15/2013    History reviewed. No pertinent surgical history.     Home Medications    Prior to Admission medications   Medication Sig Start Date End Date Taking? Authorizing Provider  doxycycline (VIBRAMYCIN) 100 MG capsule TAKE 1 CAPSULE BY MOUTH  DAILY. CONTINUE TAKING FOR  4 WEEKS AFTER RETURN TO US. 08/07/17  Yes Almon HerculesGonfa, Taye T, MD  ezetimibe (ZETIA) 10 MG tablet Take 1 tablet (10 mg total) daily by mouth. 07/31/17  Yes Gonfa, Boyce Mediciaye T, MD  ibuprofen (ADVIL,MOTRIN) 100 MG tablet  Take 100 mg by mouth every 6 (six) hours as needed for fever.   Yes [provider]  lisinopril (PRINIVIL,ZESTRIL) 20 MG tablet Take 1 tablet (20 mg total) daily by mouth. 07/31/17  Yes Gonfa, Taye T, MD  pravastatin (PRAVACHOL) 40 MG tablet Take 1 tablet (40 mg total) by mouth daily. 06/15/17  Yes Candelaria StagersGonfa, Taye T, MD  VOLTAREN 1 % GEL APPLY 2 GRAMS TOPICALLY 4  TIMES DAILY 08/07/17  Yes Candelaria StagersGonfa, Taye T, MD  clotrimazole (LOTRIMIN) 1 % cream Apply to affected area 2 times daily 05/20/16   Riki SheerYoung, Michelle G, PA-C  neomycin-polymyxin-hydrocortisone (CORTISPORIN) 3.5-10000-1 otic suspension  01/18/16   [provider]  pantoprazole (PROTONIX) 40 MG tablet Take 1 tablet (40 mg total) by mouth daily. 04/04/17   Almon HerculesGonfa, Taye T, MD  tadalafil (CIALIS) 5 MG tablet Take 1 tablet (5 mg total) by mouth every other day as needed for erectile dysfunction. 05/15/17   Almon HerculesGonfa, Taye T, MD    Family History Family History  Problem Relation Age of Onset  . Heart failure Father     Social History Social History   Tobacco Use  . Smoking status: Never Smoker  . Smokeless tobacco: Never Used  Substance Use Topics  . Alcohol use: Yes    Comment: social  . Drug use: No     Allergies   Sulfamethoxazole; Statins; and Sulfa antibiotics   Review of Systems Review of Systems  Reason unable to perform  ROS: See HPI as above.     Physical Exam Triage Vital Signs ED Triage Vitals  Enc Vitals Group     BP 09/03/17 1600 135/78     Pulse Rate 09/03/17 1600 63     Resp --      Temp 09/03/17 1600 98.4 F (36.9 C)     Temp Source 09/03/17 1600 Oral     SpO2 09/03/17 1600 99 %     Weight --      Height --      Head Circumference --      Peak Flow --      Pain Score 09/03/17 1555 6     Pain Loc --      Pain Edu? --      Excl. in GC? --    No data found.  Updated Vital Signs BP 135/78 (BP Location: Left Arm)   Pulse 63   Temp 98.4 F (36.9 C) (Oral)   SpO2 99%   Physical Exam    Constitutional: He is oriented to person, place, and time. He appears well-developed and well-nourished. No distress.  HENT:  Head: Normocephalic and atraumatic.  Eyes: Conjunctivae are normal. Pupils are equal, round, and reactive to light.  Musculoskeletal:  See picture below. No tenderness on palpation. No erythema, increased warmth, swelling. Full ROM of knee. Strength normal and equal bilaterally. Sensation intact and equal bilaterally.   Neurological: He is alert and oriented to person, place, and time.        UC Treatments / Results  Labs (all labs ordered are listed, but only abnormal results are displayed) Labs Reviewed - No data to display  EKG  EKG Interpretation None       Radiology No results found.  Procedures Procedures (including critical care time)  Medications Ordered in UC Medications - No data to display   Initial Impression / Assessment and Plan / UC Course  I have reviewed the triage vital signs and the nursing notes.  Pertinent labs & imaging results that were available during my care of the patient were reviewed by me and considered in my medical decision making (see chart for details).    Discoloration consistent with contusion as it is adjacent to injection site per patient. Will have patient apply heat compress. Patient worried about resolution of chlamydia, but has not finished course of antibiotics. Will have patient follow up with PCP once he finished course of doxycycline for recheck of STDs as well as recheck of skin discoloration. Return precautions given.   Final Clinical Impressions(s) / UC Diagnoses   Final diagnoses:  Contusion of right knee, initial encounter    ED Discharge Orders    None        Belinda FisherYu, Citlalli Weikel V, PA-C 09/03/17 1629

## 2017-09-04 ENCOUNTER — Ambulatory Visit (INDEPENDENT_AMBULATORY_CARE_PROVIDER_SITE_OTHER): Payer: 59 | Admitting: Orthopaedic Surgery

## 2017-09-04 ENCOUNTER — Encounter (INDEPENDENT_AMBULATORY_CARE_PROVIDER_SITE_OTHER): Payer: Self-pay | Admitting: Orthopaedic Surgery

## 2017-09-04 DIAGNOSIS — M1712 Unilateral primary osteoarthritis, left knee: Secondary | ICD-10-CM | POA: Diagnosis not present

## 2017-09-04 DIAGNOSIS — M1711 Unilateral primary osteoarthritis, right knee: Secondary | ICD-10-CM

## 2017-09-04 NOTE — Progress Notes (Signed)
   Office Visit Note   Patient: Jared DareGeorge Holt           Date of Birth: 10/28/1960           MRN: [id number] Visit Date: 09/04/2017              Requested by: Almon HerculesGonfa, Taye T, MD [address] [place], [place] [id number] PCP: Almon HerculesGonfa, Taye T, MD   Assessment & Plan: Visit Diagnoses:  1. Unilateral primary osteoarthritis, right knee   2. Unilateral primary osteoarthritis, left knee     Plan: Impression is advanced degenerative joint disease worse in the patellofemoral compartment.  Continue with Aleve and Tylenol and Voltaren gel as needed.  Prescription for physical therapy.  Questions encouraged and answered.  Follow-up as needed. Total face to face encounter time was greater than 25 minutes and over half of this time was spent in counseling and/or coordination of care. Follow-Up Instructions: Return if symptoms worsen or fail to improve.   Orders:  No orders of the defined types were placed in this encounter.  No orders of the defined types were placed in this encounter.     Procedures: No procedures performed   Clinical Data: No additional findings.   Subjective: Chief Complaint  Patient presents with  . Right Knee - Pain    Patient follows up today for his right knee pain.  Previous injection did not help much.  He denies any numbness and tingling.    Review of Systems  Constitutional: Negative.   All other systems reviewed and are negative.    Objective: Vital Signs: There were no vitals taken for this visit.  Physical Exam  Constitutional: He is oriented to person, place, and time. He appears well-developed and well-nourished.  Pulmonary/Chest: Effort normal.  Abdominal: Soft.  Neurological: He is alert and oriented to person, place, and time.  Skin: Skin is warm.  Psychiatric: He has a normal mood and affect. His behavior is normal. Judgment and thought content normal.  Nursing note and vitals reviewed.   Ortho Exam Right knee exam shows no joint  effusion.  Collaterals and cruciates are stable. Specialty Comments:  No specialty comments available.  Imaging: No results found.   PMFS History: Patient Active Problem List   Diagnosis Date Noted  . Elevated liver enzymes 04/04/2017  . Abdominal pain, epigastric 04/04/2017  . Anterior knee pain, right 11/22/2016  . Neutropenia (HCC) 10/07/2016  . Risky sexual behavior 05/12/2016  . Thrombocytopenia (HCC) 09/08/2015  . Erectile dysfunction 09/08/2015  . Travel advice encounter 09/08/2015  . Routine health maintenance 09/08/2015  . Dysfunction of eustachian tube 10/08/2013  . Otalgia of right ear 10/08/2013  . LBP (low back pain) 09/10/2013  . HLD (hyperlipidemia) 04/15/2013  . BP (high blood pressure) 04/15/2013   Past Medical History:  Diagnosis Date  . High cholesterol   . Hypertension   . Weakness     Family History  Problem Relation Age of Onset  . Heart failure Father     History reviewed. No pertinent surgical history. Social History   Occupational History  . Not on file  Tobacco Use  . Smoking status: Never Smoker  . Smokeless tobacco: Never Used  Substance and Sexual Activity  . Alcohol use: Yes    Comment: social  . Drug use: No  . Sexual activity: Not on file

## 2017-09-06 ENCOUNTER — Other Ambulatory Visit: Payer: Self-pay

## 2017-09-06 ENCOUNTER — Ambulatory Visit: Payer: 59 | Admitting: Family Medicine

## 2017-09-06 ENCOUNTER — Encounter: Payer: Self-pay | Admitting: Family Medicine

## 2017-09-06 ENCOUNTER — Other Ambulatory Visit (HOSPITAL_COMMUNITY)
Admission: RE | Admit: 2017-09-06 | Discharge: 2017-09-06 | Disposition: A | Discharge status: Home / Self Care | Payer: 59 | Source: Ambulatory Visit | Attending: Family Medicine | Admitting: Family Medicine

## 2017-09-06 VITALS — BP 112/60 | HR 64 | Temp 97.9°F | Wt 179.6 lb

## 2017-09-06 DIAGNOSIS — Z23 Encounter for immunization: Secondary | ICD-10-CM

## 2017-09-06 DIAGNOSIS — Z0000 Encounter for general adult medical examination without abnormal findings: Secondary | ICD-10-CM

## 2017-09-06 DIAGNOSIS — S8001XD Contusion of right knee, subsequent encounter: Secondary | ICD-10-CM | POA: Diagnosis not present

## 2017-09-06 DIAGNOSIS — Z113 Encounter for screening for infections with a predominantly sexual mode of transmission: Secondary | ICD-10-CM | POA: Diagnosis not present

## 2017-09-06 MED ORDER — AZITHROMYCIN 500 MG PO TABS
1000.0000 mg | ORAL_TABLET | Freq: Once | ORAL | Status: AC
  Administered 2017-09-06: 1000 mg via ORAL

## 2017-09-06 MED ORDER — CEFTRIAXONE SODIUM 250 MG IJ SOLR
250.0000 mg | Freq: Once | INTRAMUSCULAR | Status: AC
  Administered 2017-09-06: 250 mg via INTRAMUSCULAR

## 2017-09-06 MED ORDER — AZITHROMYCIN 1 G PO PACK: 1.0000 g | PACK | Freq: Once | ORAL | Status: DC

## 2017-09-06 NOTE — Patient Instructions (Addendum)
It was a pleasure meeting you! Today we discussed your knee skin discoloration, your genital burning, your cholesterol, and a flu shot. You had a urine gonorrhea and chlamydia test done. I will call you with these results. You were given a flu vaccine today. Unfortunately the lipid panel is best done as a fasting test and will thus be inaccurate. Please remind your provider to get this test done at your next appointment. You were given a shot of rocephin for treatment of possible gonorrhea. A prescription was given to you for azithromycin for treatment of your possible chlamydia.

## 2017-09-07 ENCOUNTER — Telehealth (INDEPENDENT_AMBULATORY_CARE_PROVIDER_SITE_OTHER): Payer: Self-pay | Admitting: Orthopaedic Surgery

## 2017-09-07 LAB — URINE CYTOLOGY ANCILLARY ONLY
Chlamydia: NEGATIVE
Neisseria Gonorrhea: NEGATIVE

## 2017-09-07 NOTE — Telephone Encounter (Signed)
Beth at [facility] needs physical therapy orders faxed over to [phone].

## 2017-09-07 NOTE — Progress Notes (Signed)
HPI 56 year old male who presents for soreness in his genital area. He states that the soreness in his penis originally started around 2 weeks ago. He was out of the country in [place] at that time and was originally screened at a hospital in that country. He was told he had chlamydia. He had already been on doxycycline ppx for malaria, so he was told to just double his dose to 200mg  bid per his report. He states that the pain has really not gotten much better. The pain is described as a "burning on the inside of his penis". He believes he contracted the std in [place].  He also presents to have some discoloration of his skin around his knee looked at. He was suffering from arthritis pain and also had a knee injection performed in [place]. Since that time he has been noticing some discoloration around when they injected. He has been having no pain and no limitation. No fevers, no erythema around that area. He was evaluated at urgent care and it was felt that the discoloration was likely due to a small amount of knee contusion from the injection area.  Patient received flu shot at today's visit. He was interested in a lipid panel but was not fasting.    CC: Penile soreness   ROS: Review of Systems  Constitutional: Negative for chills and fever.  HENT: Negative for congestion, hearing loss and sore throat.   Eyes: Negative for pain, discharge and redness.  Respiratory: Negative for cough, hemoptysis and wheezing.   Cardiovascular: Negative for chest pain and palpitations.  Gastrointestinal: Negative for abdominal pain, constipation, diarrhea, nausea and vomiting.  Genitourinary: Negative for dysuria, frequency and urgency.       "burning sensation on the inside of my penis"  Musculoskeletal: Negative for myalgias.  Skin: Positive for itching. Negative for rash.  Neurological: Negative for dizziness, sensory change, weakness and headaches.  Psychiatric/Behavioral: Negative for depression.     Review of Systems See HPI for ROS.   CC, SH/smoking status, and VS noted  Objective: BP 112/60   Pulse 64   Temp 97.9 F (36.6 C) (Oral)   Wt 179 lb 9.6 oz (81.5 kg)   SpO2 98%   BMI 25.05 kg/m  Gen: NAD, alert, cooperative, and pleasant. HEENT: NCAT, EOMI, PERRL CV: RRR, no murmur Resp: CTAB, no wheezes, non-labored Abd: SNTND, BS present, no guarding or organomegaly Ext: No edema, warm Neuro: Alert and oriented, Speech clear, No gross deficits   Assessment and plan:  Contusion of right knee Likely knee contusion from injection site. No concerning signs or symptoms of infection. Given patient's history of thrombocytopenia likely took longer than usual for his blood to clot after the injection, resulting in a larger than normal contusion into the injection site. Will have him follow up if he develops concerning symptoms but nothing to do at this time.  Routine health maintenance Received flu shot at today's visit. Deferred lipid panel, will need to come back when he is fasting.  Screen for STD (sexually transmitted disease) Given patient likely contacted syphillis in [place] and he was already on doxycycline ppx I am concerned that he may have contracted a resistant strain. I am also unfamiliar with how std screening is performed in [place] and am unsure if the concurrently screened for gonorrhea. Will repeat std screening. Will treat with ctx injection and 1g azithromycin. He possibly has just some residual urethritis from his already treated chlamydia but I am concerned he still  has infection. - gc/chlamydia screen - ctx and azithromycin in office - f/u in 2 weeks of symptoms fail to improve   Orders Placed This Encounter  Procedures  . Flu Vaccine QUAD 36+ mos IM    Meds ordered this encounter  Medications  . cefTRIAXone (ROCEPHIN) injection 250 mg  . DISCONTD: azithromycin (ZITHROMAX) powder 1 g  . azithromycin (ZITHROMAX) tablet 1,000 mg    Myrene BuddyJacob Lorra Freeman  MD PGY-1 Family Medicine Resident 09/13/2017 9:59 AM

## 2017-09-07 NOTE — Telephone Encounter (Signed)
FAXED ORDER  

## 2017-09-08 ENCOUNTER — Telehealth: Payer: Self-pay | Admitting: Family Medicine

## 2017-09-08 NOTE — Telephone Encounter (Signed)
Called patient to inform him of negative gc/chalmydia. He does feel better with the treatment he was given. Informed him that if his pain returned to please follow up in around a week for further evaluation.

## 2017-09-13 ENCOUNTER — Encounter: Payer: Self-pay | Admitting: Family Medicine

## 2017-09-13 DIAGNOSIS — Z113 Encounter for screening for infections with a predominantly sexual mode of transmission: Secondary | ICD-10-CM | POA: Insufficient documentation

## 2017-09-13 DIAGNOSIS — S8001XA Contusion of right knee, initial encounter: Secondary | ICD-10-CM | POA: Insufficient documentation

## 2017-09-13 DIAGNOSIS — Z23 Encounter for immunization: Secondary | ICD-10-CM | POA: Insufficient documentation

## 2017-09-13 NOTE — Assessment & Plan Note (Signed)
Likely knee contusion from injection site. No concerning signs or symptoms of infection. Given patient's history of thrombocytopenia likely took longer than usual for his blood to clot after the injection, resulting in a larger than normal contusion into the injection site. Will have him follow up if he develops concerning symptoms but nothing to do at this time.

## 2017-09-13 NOTE — Assessment & Plan Note (Signed)
Received flu shot at today's visit. Deferred lipid panel, will need to come back when he is fasting.

## 2017-09-13 NOTE — Assessment & Plan Note (Signed)
Given patient likely contacted syphillis in [place] and he was already on doxycycline ppx I am concerned that he may have contracted a resistant strain. I am also unfamiliar with how std screening is performed in [place] and am unsure if the concurrently screened for gonorrhea. Will repeat std screening. Will treat with ctx injection and 1g azithromycin. He possibly has just some residual urethritis from his already treated chlamydia but I am concerned he still has infection. - gc/chlamydia screen - ctx and azithromycin in office - f/u in 2 weeks of symptoms fail to improve

## 2017-09-17 ENCOUNTER — Ambulatory Visit (HOSPITAL_COMMUNITY)
Admission: EM | Admit: 2017-09-17 | Discharge: 2017-09-17 | Disposition: A | Discharge status: Home / Self Care | Payer: 59 | Attending: Internal Medicine | Admitting: Internal Medicine

## 2017-09-17 ENCOUNTER — Encounter (HOSPITAL_COMMUNITY): Payer: Self-pay | Admitting: Emergency Medicine

## 2017-09-17 ENCOUNTER — Other Ambulatory Visit: Payer: Self-pay

## 2017-09-17 DIAGNOSIS — I10 Essential (primary) hypertension: Secondary | ICD-10-CM

## 2017-09-17 DIAGNOSIS — R42 Dizziness and giddiness: Secondary | ICD-10-CM

## 2017-09-17 NOTE — ED Triage Notes (Addendum)
Feeling weak and lightheaded.  Onset this morning  Right knee problem since receiving an injection.  Pain and looks like a bruise.

## 2017-09-17 NOTE — ED Provider Notes (Signed)
[facility]    CSN: [id number] Arrival date & time: 09/17/17  1931     History   Chief Complaint Chief Complaint  Patient presents with  . Dizziness    HPI Jared Holt is a 56 y.o. male.   He has been under some stress, just returned from a trip to [place] and is leaving on January 1 or 2 for [place] on a work assignment.  He observed elevated blood pressure at home today, 150 systolic, usually about 120.  He checked it several times and was concerned that it seemed to be going higher, took an early dose of lisinopril (usually takes 20 mg at bedtime).  Now feeling kind of lightheaded.  No chest pain, no dyspnea, no leg swelling.  No headache.  Was able to walk into the urgent care independently, climb on/off the exam table, speech is clear and coherent.   HPI  Past Medical History:  Diagnosis Date  . High cholesterol   . Hypertension   . Weakness     Patient Active Problem List   Diagnosis Date Noted  . Screen for STD (sexually transmitted disease) 09/13/2017  . Need for immunization against influenza 09/13/2017  . Contusion of right knee 09/13/2017  . Elevated liver enzymes 04/04/2017  . Abdominal pain, epigastric 04/04/2017  . Anterior knee pain, right 11/22/2016  . Neutropenia (HCC) 10/07/2016  . Risky sexual behavior 05/12/2016  . Thrombocytopenia (HCC) 09/08/2015  . Erectile dysfunction 09/08/2015  . Travel advice encounter 09/08/2015  . Routine health maintenance 09/08/2015  . Dysfunction of eustachian tube 10/08/2013  . Otalgia of right ear 10/08/2013  . LBP (low back pain) 09/10/2013  . HLD (hyperlipidemia) 04/15/2013  . BP (high blood pressure) 04/15/2013    History reviewed. No pertinent surgical history.     Home Medications    Prior to Admission medications   Medication Sig Start Date End Date Taking? Authorizing Provider  clotrimazole (LOTRIMIN) 1 % cream Apply to affected area 2 times daily 05/20/16   Riki SheerYoung, Michelle G, PA-C    doxycycline (VIBRAMYCIN) 100 MG capsule TAKE 1 CAPSULE BY MOUTH  DAILY. CONTINUE TAKING FOR  4 WEEKS AFTER RETURN TO US. 08/07/17   Almon HerculesGonfa, Taye T, MD  ezetimibe (ZETIA) 10 MG tablet Take 1 tablet (10 mg total) daily by mouth. 07/31/17   Almon HerculesGonfa, Taye T, MD  ibuprofen (ADVIL,MOTRIN) 100 MG tablet Take 100 mg by mouth every 6 (six) hours as needed for fever.    [provider]  lisinopril (PRINIVIL,ZESTRIL) 20 MG tablet Take 1 tablet (20 mg total) daily by mouth. 07/31/17   Almon HerculesGonfa, Taye T, MD  neomycin-polymyxin-hydrocortisone (CORTISPORIN) 3.5-10000-1 otic suspension  01/18/16   [provider]  pantoprazole (PROTONIX) 40 MG tablet Take 1 tablet (40 mg total) by mouth daily. 04/04/17   Almon HerculesGonfa, Taye T, MD  pravastatin (PRAVACHOL) 40 MG tablet Take 1 tablet (40 mg total) by mouth daily. 06/15/17   Almon HerculesGonfa, Taye T, MD  tadalafil (CIALIS) 5 MG tablet Take 1 tablet (5 mg total) by mouth every other day as needed for erectile dysfunction. 05/15/17   Almon HerculesGonfa, Taye T, MD  VOLTAREN 1 % GEL APPLY 2 GRAMS TOPICALLY 4  TIMES DAILY 08/07/17   Almon HerculesGonfa, Taye T, MD    Family History Family History  Problem Relation Age of Onset  . Heart failure Father     Social History Social History   Tobacco Use  . Smoking status: Never Smoker  . Smokeless tobacco: Never Used  Substance Use Topics  . Alcohol use: Yes    Comment: social  . Drug use: No     Allergies   Sulfamethoxazole; Statins; and Sulfa antibiotics   Review of Systems Review of Systems  All other systems reviewed and are negative.    Physical Exam Triage Vital Signs ED Triage Vitals  Enc Vitals Group     BP 09/17/17 2043 (!) 153/88     Pulse Rate 09/17/17 2043 65     Resp 09/17/17 2043 18     Temp 09/17/17 2043 98.2 F (36.8 C)     Temp Source 09/17/17 2043 Oral     SpO2 09/17/17 2043 100 %     Weight --      Height --      Pain Score 09/17/17 2044 6     Pain Loc --    Orthostatic VS for the past 24 hrs:  BP- Lying  Pulse- Lying BP- Sitting Pulse- Sitting BP- Standing at 0 minutes Pulse- Standing at 0 minutes  09/17/17 2049 136/80 59 131/87 60 124/86 67    Updated Vital Signs BP (!) 153/88 (BP Location: Left Arm) Comment: Notified RN  Pulse 65   Temp 98.2 F (36.8 C) (Oral)   Resp 18   SpO2 100%  Physical Exam  Constitutional: He is oriented to person, place, and time. No distress.  Alert, nicely groomed  HENT:  Head: Atraumatic.  Eyes:  Conjugate gaze, no eye redness/drainage  Neck: Neck supple.  Cardiovascular: Normal rate and regular rhythm.  Pulmonary/Chest: No respiratory distress. He has no wheezes. He has no rales.  Abdominal: He exhibits no distension.  Musculoskeletal: Normal range of motion. He exhibits no edema.  Superior to right knee, hyperpigmented patch, irregular borders, about 3 x 4".  Not tender, not swollen.  Appeared after knee injection, ?bruise.  Neurological: He is alert and oriented to person, place, and time.  Skin: Skin is warm and dry.  No cyanosis  Nursing note and vitals reviewed.    UC Treatments / Results   Procedures Procedures (including critical care time) None today  Final Clinical Impressions(s) / UC Diagnoses   Final diagnoses:  Elevated blood pressure reading with diagnosis of hypertension  Lightheadedness   No danger signs on exam today.  Mildly elevated blood pressure readings today, without symptoms such as headache, chest pain, breathlessness, may not represent a pattern requiring increased medication.  Check blood pressure once daily, in the mornings, and keep a record, to discuss with your primary care provider.  Continue current dose of lisinopril, 20 mg, you could take this in the morning or at night.    Isa RankinMurray, Janiesha Diehl Wilson, MD 09/19/17 Corky [email or web address]

## 2017-09-17 NOTE — Discharge Instructions (Addendum)
No danger signs on exam today.  Mildly elevated blood pressure readings today, without symptoms such as headache, chest pain, breathlessness, may not represent a pattern requiring increased medication.  Check blood pressure once daily, in the mornings, and keep a record, to discuss with your primary care provider.  Continue current dose of lisinopril, 20 mg, you could take this in the morning or at night.

## 2020-04-05 ENCOUNTER — Encounter (HOSPITAL_COMMUNITY): Payer: Self-pay

## 2020-04-05 ENCOUNTER — Other Ambulatory Visit: Payer: Self-pay

## 2020-04-05 ENCOUNTER — Ambulatory Visit (HOSPITAL_COMMUNITY)
Admission: EM | Admit: 2020-04-05 | Discharge: 2020-04-05 | Disposition: A | Discharge status: Home / Self Care | Payer: Self-pay

## 2020-04-05 DIAGNOSIS — R1013 Epigastric pain: Secondary | ICD-10-CM

## 2020-04-05 HISTORY — DX: Acute pancreatitis without necrosis or infection, unspecified: K85.90

## 2020-04-05 MED ORDER — PANTOPRAZOLE SODIUM 40 MG PO TBEC: 40.0000 mg | DELAYED_RELEASE_TABLET | Freq: Every day | ORAL | 1 refills | Status: DC

## 2020-04-05 NOTE — Discharge Instructions (Addendum)
Please take Protonix daily to help reduce acid production. Use Tums for short term relief. We will order a ultrasound to rule out Gallbladder disease and refer to gastroenterology for further studies. If pain worsens please go to the ER. Avoid foods high in acid, antiinflammatory medication such as ibuprofen, advil or aleve.

## 2020-04-05 NOTE — ED Provider Notes (Signed)
[facility]    CSN: [id number] Arrival date & time: 04/05/20  1012      History   Chief Complaint Chief Complaint  Patient presents with  . Abdominal Pain    HPI Jared Holt is a 59 y.o. male. He reports abdominal pain when he wakes up in the mornings. Pain is located in the upper abdomen. Will resolve throughout the day. Describes pain as sharp. Denies nausea vomiting or change in bowel habits. No difficulties with eating. But rather symptoms improve some with eating. Symptoms began a week ago   He has tried Pepcid. Pain has worsened since yesterday  Denies abdominal surgeries    HPI  Past Medical History:  Diagnosis Date  . High cholesterol   . Hypertension   . Pancreatitis   . Weakness     Patient Active Problem List   Diagnosis Date Noted  . Screen for STD (sexually transmitted disease) 09/13/2017  . Need for immunization against influenza 09/13/2017  . Contusion of right knee 09/13/2017  . Elevated liver enzymes 04/04/2017  . Abdominal pain, epigastric 04/04/2017  . Anterior knee pain, right 11/22/2016  . Neutropenia (HCC) 10/07/2016  . Risky sexual behavior 05/12/2016  . Thrombocytopenia (HCC) 09/08/2015  . Erectile dysfunction 09/08/2015  . Travel advice encounter 09/08/2015  . Routine health maintenance 09/08/2015  . Dysfunction of eustachian tube 10/08/2013  . Otalgia of right ear 10/08/2013  . LBP (low back pain) 09/10/2013  . HLD (hyperlipidemia) 04/15/2013  . BP (high blood pressure) 04/15/2013    History reviewed. No pertinent surgical history.     Home Medications    Prior to Admission medications   Medication Sig Start Date End Date Taking? Authorizing Provider  Aspirin Buf,CaCarb-MgCarb-MgO, 81 MG TABS Take by mouth daily.   Yes [provider]  ezetimibe (ZETIA) 10 MG tablet Take 1 tablet (10 mg total) daily by mouth. 07/31/17  Yes Gonfa, Taye T, MD  lisinopril (PRINIVIL,ZESTRIL) 20 MG tablet Take 1 tablet (20  mg total) daily by mouth. 07/31/17  Yes Gonfa, Taye T, MD  pravastatin (PRAVACHOL) 40 MG tablet Take 1 tablet (40 mg total) by mouth daily. 06/15/17  Yes Almon Hercules, MD  tamsulosin (FLOMAX) 0.4 MG CAPS capsule Take by mouth. 08/08/19  Yes [provider]  clotrimazole (LOTRIMIN) 1 % cream Apply to affected area 2 times daily 05/20/16   Riki Sheer, PA-C  doxycycline (VIBRAMYCIN) 100 MG capsule TAKE 1 CAPSULE BY MOUTH  DAILY. CONTINUE TAKING FOR  4 WEEKS AFTER RETURN TO [place]. 08/07/17   Almon Hercules, MD  ibuprofen (ADVIL,MOTRIN) 100 MG tablet Take 100 mg by mouth every 6 (six) hours as needed for fever.    [provider]  neomycin-polymyxin-hydrocortisone (CORTISPORIN) 3.5-10000-1 otic suspension  01/18/16   [provider]  pantoprazole (PROTONIX) 40 MG tablet Take 1 tablet (40 mg total) by mouth daily. 04/05/20   Chilton Si, PA-C  tadalafil (CIALIS) 5 MG tablet Take 1 tablet (5 mg total) by mouth every other day as needed for erectile dysfunction. 05/15/17   Almon Hercules, MD  VOLTAREN 1 % GEL APPLY 2 GRAMS TOPICALLY 4  TIMES DAILY 08/07/17   Almon Hercules, MD    Family History Family History  Problem Relation Age of Onset  . Heart failure Father     Social History Social History   Tobacco Use  . Smoking status: Never Smoker  . Smokeless tobacco: Never Used  Substance Use Topics  .  Alcohol use: Yes    Comment: social  . Drug use: No     Allergies   Sulfamethoxazole, Statins, and Sulfa antibiotics   Review of Systems Review of Systems  Constitutional: Negative for chills and fever.  HENT: Negative for trouble swallowing.   Cardiovascular: Negative for chest pain.  Gastrointestinal: Positive for abdominal pain. Negative for constipation, diarrhea, nausea and vomiting.  Musculoskeletal: Negative for back pain.  Neurological: Negative for headaches.     Physical Exam Triage Vital Signs ED Triage Vitals  Enc Vitals Group     BP  04/05/20 1051 110/74     Pulse Rate 04/05/20 1051 (!) 59     Resp 04/05/20 1051 16     Temp 04/05/20 1051 97.7 F (36.5 C)     Temp Source 04/05/20 1051 Oral     SpO2 04/05/20 1051 99 %     Weight --      Height --      Head Circumference --      Peak Flow --      Pain Score 04/05/20 1050 7     Pain Loc --      Pain Edu? --      Excl. in GC? --    No data found.  Updated Vital Signs BP 110/74 (BP Location: Left Arm)   Pulse (!) 59   Temp 97.7 F (36.5 C) (Oral)   Resp 16   SpO2 99%   Visual Acuity Right Eye Distance:   Left Eye Distance:   Bilateral Distance:    Right Eye Near:   Left Eye Near:    Bilateral Near:     Physical Exam Vitals reviewed.  Constitutional:      General: He is not in acute distress.    Appearance: He is well-developed and normal weight.  HENT:     Head: Normocephalic.  Eyes:     General: No scleral icterus. Cardiovascular:     Heart sounds: Normal heart sounds.  Pulmonary:     Effort: Pulmonary effort is normal. No respiratory distress.     Breath sounds: Normal breath sounds.  Abdominal:     General: Abdomen is flat. Bowel sounds are normal. There is no distension.     Tenderness: Negative signs include Murphy's sign.  Neurological:     Mental Status: He is alert.      UC Treatments / Results  Labs (all labs ordered are listed, but only abnormal results are displayed) Labs Reviewed - No data to display  EKG   Radiology No results found.  Procedures Procedures (including critical care time)  Medications Ordered in UC Medications - No data to display  Initial Impression / Assessment and Plan / UC Course  I have reviewed the triage vital signs and the nursing notes.  Pertinent labs & imaging results that were available during my care of the patient were reviewed by me and considered in my medical decision making (see chart for details).     Upper abdominal pain. DDX includes Gallbladder disease, Ulcer disease,  pancreatitis.   Final Clinical Impressions(s) / UC Diagnoses   Final diagnoses:  Abdominal pain, epigastric     Discharge Instructions     Please take Protonix daily to help reduce acid production. Use Tums for short term relief. We will order a ultrasound to rule out Gallbladder disease and refer to gastroenterology for further studies. If pain worsens please go to the ER. Avoid foods high in acid, antiinflammatory medication such as  ibuprofen, advil or aleve.     ED Prescriptions    Medication Sig Dispense Auth. Provider   pantoprazole (PROTONIX) 40 MG tablet Take 1 tablet (40 mg total) by mouth daily. 30 tablet Chilton Si, [place]     PDMP not reviewed this encounter.   Chilton Si, PA-C 04/05/20 1146

## 2020-04-05 NOTE — ED Triage Notes (Signed)
Pt c/o upper abdomen pain "sore feeling" x1 week then became more generalized in area; pt reports he has taken pepto bismol and pepto heartburn with some relief, but awoke today with increased pain and change in stool to "dark brown" in color. Also reports general malaise. Reports h/o pancreatitis approx 10 years ago.  Denies blood in stool/tissue, diarrhea, n/v, chills, CP, back pain, congestion, runny nose or other URI sx.    Appetite intact

## 2020-04-09 ENCOUNTER — Other Ambulatory Visit: Payer: Self-pay

## 2020-04-09 ENCOUNTER — Ambulatory Visit (HOSPITAL_COMMUNITY)
Admission: RE | Admit: 2020-04-09 | Discharge: 2020-04-09 | Disposition: A | Discharge status: Home / Self Care | Payer: Self-pay | Source: Ambulatory Visit | Attending: Emergency Medicine | Admitting: Emergency Medicine

## 2020-04-09 DIAGNOSIS — N281 Cyst of kidney, acquired: Secondary | ICD-10-CM | POA: Insufficient documentation

## 2020-04-10 ENCOUNTER — Ambulatory Visit (INDEPENDENT_AMBULATORY_CARE_PROVIDER_SITE_OTHER): Payer: Self-pay | Admitting: Family Medicine

## 2020-04-10 ENCOUNTER — Encounter: Payer: Self-pay | Admitting: Family Medicine

## 2020-04-10 ENCOUNTER — Other Ambulatory Visit: Payer: Self-pay

## 2020-04-10 VITALS — BP 128/68 | HR 74 | Ht 70.0 in | Wt 185.0 lb

## 2020-04-10 DIAGNOSIS — R1013 Epigastric pain: Secondary | ICD-10-CM

## 2020-04-10 NOTE — Patient Instructions (Signed)
It was good to see you today.  Thank you for coming in.  We are ordering labs for your blood levels, liver function, pancreas and to rule out infection.  We are also referring you to GI to see if they have any further recommendations and want to perform and Upper Endoscopy scope.  I would like to see you again in 1 month for follow-up anf further care.    Be Well, Dr Jovita Kussmaul

## 2020-04-11 LAB — COMPREHENSIVE METABOLIC PANEL
ALT: 26 IU/L (ref 0–44)
AST: 22 IU/L (ref 0–40)
Albumin/Globulin Ratio: 1.6 (ref 1.2–2.2)
Albumin: 4.4 g/dL (ref 3.8–4.9)
Alkaline Phosphatase: 61 IU/L (ref 48–121)
BUN/Creatinine Ratio: 10 (ref 10–24)
BUN: 12 mg/dL (ref 8–27)
Bilirubin Total: 0.3 mg/dL (ref 0.0–1.2)
CO2: 26 mmol/L (ref 20–29)
Calcium: 9.5 mg/dL (ref 8.6–10.2)
Chloride: 100 mmol/L (ref 96–106)
Creatinine, Ser: 1.16 mg/dL (ref 0.76–1.27)
GFR calc Af Amer: 79 mL/min/{1.73_m2} (ref >59)
GFR calc non Af Amer: 68 mL/min/{1.73_m2} (ref >59)
Globulin, Total: 2.8 g/dL (ref 1.5–4.5)
Glucose: 94 mg/dL (ref 65–99)
Potassium: 4.5 mmol/L (ref 3.5–5.2)
Sodium: 140 mmol/L (ref 134–144)
Total Protein: 7.2 g/dL (ref 6.0–8.5)

## 2020-04-11 LAB — CBC WITH DIFFERENTIAL/PLATELET
Basophils Absolute: 0 10*3/uL (ref 0.0–0.2)
Basos: 1 %
EOS (ABSOLUTE): 0.2 10*3/uL (ref 0.0–0.4)
Eos: 6 %
Hematocrit: 48.5 % (ref 37.5–51.0)
Hemoglobin: 15.5 g/dL (ref 13.0–17.7)
Immature Grans (Abs): 0 10*3/uL (ref 0.0–0.1)
Immature Granulocytes: 0 %
Lymphocytes Absolute: 2.1 10*3/uL (ref 0.7–3.1)
Lymphs: 53 %
MCH: 28.7 pg (ref 26.6–33.0)
MCHC: 32 g/dL (ref 31.5–35.7)
MCV: 90 fL (ref 79–97)
Monocytes Absolute: 0.5 10*3/uL (ref 0.1–0.9)
Monocytes: 13 %
Neutrophils Absolute: 1.1 10*3/uL — ABNORMAL LOW (ref 1.4–7.0)
Neutrophils: 27 %
Platelets: 163 10*3/uL (ref 150–450)
RBC: 5.41 x10E6/uL (ref 4.14–5.80)
RDW: 13.1 % (ref 11.6–15.4)
WBC: 4 10*3/uL (ref 3.4–10.8)

## 2020-04-11 LAB — LIPASE: Lipase: 29 U/L (ref 13–78)

## 2020-04-12 LAB — H. PYLORI BREATH TEST: H pylori Breath Test: NEGATIVE

## 2020-04-12 NOTE — Assessment & Plan Note (Addendum)
Possibly due to IBS or GERD.  Need to rule out H Pylori infection.  Less likely due to Pancreatitis, Liver Abnormality, or Diveticulitis.  Currently already on PPI therapy and taking Pepcid.  Abdominal Ultrasound at Urgent Care found no abnormalities. - Will order Urea Breath test (could have false negative due to PPI and Pepcid use) - Will Pancreatic Lipase, CMP and CBC - Continue PPI and Pepcid use - Will refer to GI given consistent pain and lack of improvement with PPI, for possible possible EGD - Follow-up in 1 month

## 2020-04-12 NOTE — Progress Notes (Signed)
    SUBJECTIVE:   CHIEF COMPLAINT / HPI: Abdominal Pain  Jared Holt  Is a 59 yo male presenting with abdominal pain.  He indicates this pain has been going on for about 2 weeks.  Describes the pain as dull and achy and rates it as a 7 on a scale of 1-10.  Indicates the pain also resolves with eating.  Pain does nto radiate anywhere  Denies any n/v/d or diffculty swallowing.  Deneis fever or fatigue.  Indicates a week previously had darker bowel movements, but have now returned to normal appearance.  Patient denies smoking or other tobacco or drug use.  Takes 81 mg Aspirin but denies any other NSAID use.  Has a few glasses of wine once a month but has not had any in recent weeks.  Was seen in Urgent Care and prescribed Pantoprazole which he has been taking for a week with Pepcid.  Indicates his pain has not improved with these.  Patient indicates he had a Colonoscopy in [place] that came back normal, but did not have an EGD.  Has had previous episodes of this type of pain several years ago and believes he was diagnosed with Pancreatitis at one point, but then at another point had similar pain and was negative for Pancreatitis.     PERTINENT  PMH / PSH:   OBJECTIVE:   BP 128/68   Pulse 74   Ht 5\' 10"  (1.778 m)   Wt 185 lb (83.9 kg)   SpO2 98%   BMI 26.54 kg/m    Physical Exam Constitutional:      General: He is not in acute distress.    Appearance: He is not ill-appearing.  HENT:     Head: Normocephalic and atraumatic.  Cardiovascular:     Rate and Rhythm: Normal rate and regular rhythm.     Heart sounds: No murmur heard.  No friction rub. No gallop.   Abdominal:     General: Abdomen is flat. Bowel sounds are increased. There is no distension or abdominal bruit. There are no signs of injury.     Tenderness: There is abdominal tenderness in the right upper quadrant, epigastric area and left upper quadrant.  Neurological:     Mental Status: He is alert.     ASSESSMENT/PLAN:     Abdominal pain, epigastric Possibly due to IBS or GERD.  Need to rule out H Pylori infection.  Less likely due to Pancreatitis, Liver Abnormality, or Diveticulitis.  Currently already on PPI therapy and taking Pepcid.  Abdominal Ultrasound at Urgent Care found no abnormalities. - Will order Urea Breath test (could have false negative due to PPI and Pepcid use) - Will Pancreatic Lipase, CMP and CBC - Continue PPI and Pepcid use - Will refer to GI given consistent pain and lack of improvement with PPI, for possible possible EGD - Follow-up in 1 month      , MD [facility]

## 2020-04-21 ENCOUNTER — Encounter: Payer: Self-pay | Admitting: Nurse Practitioner

## 2020-05-08 ENCOUNTER — Other Ambulatory Visit: Payer: Self-pay | Admitting: Family Medicine

## 2020-05-08 ENCOUNTER — Encounter: Payer: Self-pay | Admitting: Family Medicine

## 2020-05-08 DIAGNOSIS — I10 Essential (primary) hypertension: Secondary | ICD-10-CM

## 2020-05-08 DIAGNOSIS — E785 Hyperlipidemia, unspecified: Secondary | ICD-10-CM

## 2020-05-08 MED ORDER — EZETIMIBE 10 MG PO TABS: 10.0000 mg | ORAL_TABLET | Freq: Every day | ORAL | 1 refills | Status: DC

## 2020-05-11 ENCOUNTER — Other Ambulatory Visit: Payer: Self-pay | Admitting: Family Medicine

## 2020-05-15 MED ORDER — EZETIMIBE 10 MG PO TABS: 10.0000 mg | ORAL_TABLET | Freq: Every day | ORAL | 1 refills | Status: DC

## 2020-05-19 ENCOUNTER — Other Ambulatory Visit: Payer: Self-pay | Admitting: Family Medicine

## 2020-05-19 DIAGNOSIS — R1013 Epigastric pain: Secondary | ICD-10-CM

## 2020-05-19 MED ORDER — HYDROCORTISONE ACETATE 25 MG RE SUPP
25.0000 mg | Freq: Two times a day (BID) | RECTAL | 0 refills | Status: DC
Start: 2020-05-19 — End: 2020-07-20

## 2020-05-28 ENCOUNTER — Other Ambulatory Visit: Payer: Self-pay | Admitting: Family Medicine

## 2020-05-28 MED ORDER — PRAMOXINE-HC EMOLLIENT BASE 1-2.5 % EX CREA: 1.0000 "application " | TOPICAL_CREAM | Freq: Two times a day (BID) | CUTANEOUS | 3 refills | Status: DC

## 2020-05-29 MED ORDER — LISINOPRIL 20 MG PO TABS: 20.0000 mg | ORAL_TABLET | Freq: Every day | ORAL | 1 refills | Status: DC

## 2020-05-29 MED ORDER — PRAVASTATIN SODIUM 40 MG PO TABS: 40.0000 mg | ORAL_TABLET | Freq: Every day | ORAL | 3 refills | Status: DC

## 2020-06-02 ENCOUNTER — Ambulatory Visit: Payer: Self-pay | Admitting: Nurse Practitioner

## 2020-06-02 ENCOUNTER — Encounter: Payer: Self-pay | Admitting: Nurse Practitioner

## 2020-06-02 ENCOUNTER — Other Ambulatory Visit (INDEPENDENT_AMBULATORY_CARE_PROVIDER_SITE_OTHER): Payer: Self-pay

## 2020-06-02 VITALS — BP 90/60 | HR 96 | Ht 69.0 in | Wt 184.4 lb

## 2020-06-02 DIAGNOSIS — R1013 Epigastric pain: Secondary | ICD-10-CM

## 2020-06-02 DIAGNOSIS — R109 Unspecified abdominal pain: Secondary | ICD-10-CM

## 2020-06-02 LAB — COMPREHENSIVE METABOLIC PANEL
ALT: 28 U/L (ref 0–53)
AST: 23 U/L (ref 0–37)
Albumin: 4.7 g/dL (ref 3.5–5.2)
Alkaline Phosphatase: 54 U/L (ref 39–117)
BUN: 17 mg/dL (ref 6–23)
CO2: 29 mEq/L (ref 19–32)
Calcium: 9.7 mg/dL (ref 8.4–10.5)
Chloride: 99 mEq/L (ref 96–112)
Creatinine, Ser: 1.38 mg/dL (ref 0.40–1.50)
GFR: 63.5 mL/min (ref >60.00)
Glucose, Bld: 97 mg/dL (ref 70–99)
Potassium: 4.2 mEq/L (ref 3.5–5.1)
Sodium: 136 mEq/L (ref 135–145)
Total Bilirubin: 0.5 mg/dL (ref 0.2–1.2)
Total Protein: 8.3 g/dL (ref 6.0–8.3)

## 2020-06-02 LAB — CBC WITH DIFFERENTIAL/PLATELET
Basophils Absolute: 0.1 10*3/uL (ref 0.0–0.1)
Basophils Relative: 1.3 % (ref 0.0–3.0)
Eosinophils Absolute: 0.3 10*3/uL (ref 0.0–0.7)
Eosinophils Relative: 6.1 % — ABNORMAL HIGH (ref 0.0–5.0)
HCT: 47.7 % (ref 39.0–52.0)
Hemoglobin: 15.8 g/dL (ref 13.0–17.0)
Lymphocytes Relative: 44.6 % (ref 12.0–46.0)
Lymphs Abs: 1.9 10*3/uL (ref 0.7–4.0)
MCHC: 33.1 g/dL (ref 30.0–36.0)
MCV: 89.3 fl (ref 78.0–100.0)
Monocytes Absolute: 0.6 10*3/uL (ref 0.1–1.0)
Monocytes Relative: 13.3 % — ABNORMAL HIGH (ref 3.0–12.0)
Neutro Abs: 1.5 10*3/uL (ref 1.4–7.7)
Neutrophils Relative %: 34.7 % — ABNORMAL LOW (ref 43.0–77.0)
Platelets: 167 10*3/uL (ref 150.0–400.0)
RBC: 5.35 Mil/uL (ref 4.22–5.81)
RDW: 14.4 % (ref 11.5–15.5)
WBC: 4.2 10*3/uL (ref 4.0–10.5)

## 2020-06-02 LAB — LIPASE: Lipase: 75 U/L — ABNORMAL HIGH (ref 11.0–59.0)

## 2020-06-02 MED ORDER — DICYCLOMINE HCL 10 MG PO CAPS
10.0000 mg | ORAL_CAPSULE | Freq: Three times a day (TID) | ORAL | 0 refills | Status: DC
Start: 2020-06-02 — End: 2020-07-20

## 2020-06-02 NOTE — Patient Instructions (Addendum)
If you are age 60 or older, your body mass index should be between 23-30. Your Body mass index is 27.23 kg/m. If this is out of the aforementioned range listed, please consider follow up with your Primary Care Provider.  If you are age 54 or younger, your body mass index should be between 19-25. Your Body mass index is 27.23 kg/m. If this is out of the aformentioned range listed, please consider follow up with your Primary Care Provider.   We have sent the following medications to your pharmacy for you to pick up at your convenience: Pantoprazole 10 mg Dicyclomine 10 mg  You have been scheduled for a CT scan of the abdomen and pelvis at [facility], 1st floor Radiology. You are scheduled on 06/08/2020  at 7:30am. You should arrive 15 minutes prior to your appointment time for registration.  Please pick up 2 bottles of contrast from [place] at least 3 days prior to your scan. The solution may taste better if refrigerated, but do NOT add ice or any other liquid to this solution. Shake well before drinking.   Please follow the written instructions below on the day of your exam:   1) Do not eat anything after 3:30am (4 hours prior to your test)   2) Drink 1 bottle of contrast @ 5:30am (2 hours prior to your exam)  Remember to shake well before drinking and do NOT pour over ice.     Drink 1 bottle of contrast @ 6:30am (1 hour prior to your exam)   You may take any medications as prescribed with a small amount of water, if necessary. If you take any of the following medications: METFORMIN, GLUCOPHAGE, [place], AVANDAMET, RIOMET, FORTAMET, [place] MET, JANUMET, GLUMETZA or METAGLIP, you MAY be asked to HOLD this medication 48 hours AFTER the exam.    Due to recent changes in healthcare laws, you may see the results of your imaging and laboratory studies on MyChart before your provider has had a chance to review them.  We understand that in some cases there may be results that are  confusing or concerning to you. Not all laboratory results come back in the same time frame and the provider may be waiting for multiple results in order to interpret others.  Please give [place] 48 hours in order for your provider to thoroughly review all the results before contacting the office for clarification of your results.     The purpose of you drinking the oral contrast is to aid in the visualization of your intestinal tract. The contrast solution may cause some diarrhea. Depending on your individual set of symptoms, you may also receive an intravenous injection of x-ray contrast/dye. Plan on being at [facility] for 45 minutes or longer, depending on the type of exam you are having performed.   If you have any questions regarding your exam or if you need to reschedule, you may call [facility] at [phone] between the hours of 8:00 am and 5:00 pm, Monday-Friday.   Use tylenol 55m take 2 tablets every 12 hours as needed for pain.   Call the office if symptoms worsen.  Due to recent changes in healthcare laws, you may see the results of your imaging and laboratory studies on MyChart before your provider has had a chance to review them.  We understand that in some cases there may be results that are confusing or concerning to you. Not all laboratory results come back in the same time  frame and the provider may be waiting for multiple results in order to interpret others.  Please give [place] 48 hours in order for your provider to thoroughly review all the results before contacting the office for clarification of your results.   Thank you for choosing [facility] Noralyn Pick, CRNP  [phone]

## 2020-06-02 NOTE — Progress Notes (Signed)
06/02/2020 Jared Holt [id number] 1960-10-25   CHIEF COMPLAINT: Abdominal pain  HISTORY OF PRESENT ILLNESS:  Jared Holt is a 59 year old male with a past medical history of glaucoma, hypertension, hypercholesterolemia, pancreatitis 2014. Bilateral cataract surgery 2019.  No past surgical history.  He presents to our office as referred by Dr. Charleston Poot for further evaluation regarding epigastric and generalized abdominal pain.  He complains of primarily having epigastric pain for the past 3 to 4 months.  He intermittently has pain to the right and left sides of his abdomen and to his lower abdomen.  No nausea or vomiting.  No dysphagia or heartburn.  He is passing normal formed brown bowel movement once daily.  No rectal bleeding or black stools.  When he has abdominal pain he sometimes feels disoriented and feels the need to lay down.  He is awakened from sleep over the past 2 nights due to having epigastric pain.  He underwent an abdominal ultrasound 77/22/2021 which was negative for gallstones.  CBC, CMP and lipase levels were normal.  H. pylori breath test was negative.  He reported having pancreatitis in 2014, etiology was unknown.  No history of hypertriglyceridemia or alcohol abuse.  No family history of pancreatic disease.  He started taking Pantoprazole 40 mg once daily in June which has mostly decreased his pain.  No weight loss.  No fever, sweats or chills.  When he has abdominal pain he often has the urge to pass a normal bowel movement.  No diarrhea.  He reported completing 2 colonoscopies while living in [place] which were completed in 2009 and April or May 2021.  He reported both colonoscopies were normal, no polyps.  I will request copies of his colonoscopy reports for further review.  No family history of esophageal, gastric, colon, liver or pancreatic cancer. No other complaints today.   Past Medical History:  Diagnosis Date  . High cholesterol   . Hypertension   .  Pancreatitis   . Weakness    No past surgical history on file.   Social History: He is married.  He has 3 daughters.  He is an Art gallery manager.  He smokes 1/2 pack of cigarettes daily for 20 years, quit smoking in 2001.  He drinks alcohol on  holidays. No history of drug use.   Family History: Mother died age 85 from natural causes.  Father died 3 years with history of heart disease. Two siblings have DM. Brother with heart disease.   Allergies  Allergen Reactions  . Sulfamethoxazole Rash    BLISTERS  . Statins     myalgia  . Sulfa Antibiotics       Outpatient Encounter Medications as of 06/02/2020  Medication Sig  . Aspirin Buf,CaCarb-MgCarb-MgO, 81 MG TABS Take by mouth daily.  . clotrimazole (LOTRIMIN) 1 % cream Apply to affected area 2 times daily  . ezetimibe (ZETIA) 10 MG tablet Take 1 tablet (10 mg total) by mouth daily.  . hydrocortisone (ANUSOL-HC) 25 MG suppository Place 1 suppository (25 mg total) rectally 2 (two) times daily.  Marland Kitchen lisinopril (ZESTRIL) 20 MG tablet Take 1 tablet (20 mg total) by mouth daily.  Marland Kitchen neomycin-polymyxin-hydrocortisone (CORTISPORIN) 3.5-10000-1 otic suspension   . pantoprazole (PROTONIX) 40 MG tablet Take 1 tablet (40 mg total) by mouth daily.  . Pramoxine-HC Emollient Base 1-2.5 % CREA Apply 1 application topically 2 (two) times daily.  . pravastatin (PRAVACHOL) 40 MG tablet Take 1 tablet (40 mg total) by mouth daily.  Marland Kitchen  tadalafil (CIALIS) 5 MG tablet Take 1 tablet (5 mg total) by mouth every other day as needed for erectile dysfunction.  . tamsulosin (FLOMAX) 0.4 MG CAPS capsule Take by mouth.  . VOLTAREN 1 % GEL APPLY 2 GRAMS TOPICALLY 4  TIMES DAILY   No facility-administered encounter medications on file as of 06/02/2020.     REVIEW OF SYSTEMS: All other systems reviewed and negative except where noted in the History of Present Illness.   PHYSICAL EXAM: BP 90/60 (BP Location: Left Arm, Patient Position: Sitting, Cuff Size: Normal)   Pulse 96    Ht 5\' 9"  (1.753 m) Comment: height measured without shoes  Wt 184 lb 6 oz (83.6 kg)   BMI 27.23 kg/m   General: Well developed 59 year old male in no acute distress. Head: Normocephalic and atraumatic. Eyes:  Sclerae non-icteric, conjunctive pink. Ears: Normal auditory acuity. Mouth: Dentition intact. No ulcers or lesions.  Neck: Supple, no lymphadenopathy or thyromegaly.  Lungs: Clear bilaterally to auscultation without wheezes, crackles or rhonchi. Heart: Regular rate and rhythm. No murmur, rub or gallop appreciated.  Abdomen: Soft, non distended.  Moderate epigastric tenderness without rebound or guarding.  No masses. No hepatosplenomegaly. Normoactive bowel sounds x 4 quadrants.  Rectal: Deferred.  Musculoskeletal: Symmetrical with no gross deformities. Skin: Warm and dry. No rash or lesions on visible extremities. Extremities: No edema. Neurological: Alert oriented x 4, no focal deficits.  Psychological:  Alert and cooperative. Normal mood and affect.  ASSESSMENT AND PLAN:  69.  59 year old male with a history of pancreatitis of unclear etiology in 2014 presents with epigastric pain, less frequent generalized abdominal pain CBC, CMP, CRP and lipase level -Abdominal/pelvic CT with oral and IV contrast -EGD benefits and risks discussed including risk with sedation, risk of bleeding, perforation and infection  -Continue pantoprazole 40 mg daily -Dicyclomine 10 mg 1 p.o. every 8 hours as needed abdominal pain -Patient to call our office if his symptoms worsen -Further recommendations to be determined after the above evaluation completed   2.  Colon cancer screening, up-to-date -Request copy of his most recent colonoscopy April or May 2021 from June 2021 GI       CC:  [place], MD

## 2020-06-04 ENCOUNTER — Telehealth: Payer: Self-pay | Admitting: General Surgery

## 2020-06-04 NOTE — Telephone Encounter (Signed)
Patients phone rang around 5 times then went silent. Unable to leave a voicemessage.

## 2020-06-04 NOTE — Telephone Encounter (Signed)
-----   Message from Colleen M Kennedy-Smith, NP sent at 06/04/2020 10:30 AM EDT ----- Makena Murdock, pls call patient and let him know his lipase (pancreatic enzyme is a little elevated). Please provide him with a lab order to check a fasting Triglyceride level, lipase and amylase level in 1 week. Pt to proceed with CTAP with contrast and EGD as ordered. Thx  

## 2020-06-05 NOTE — Progress Notes (Signed)
Attending Physician's Attestation   I have reviewed the chart.   I agree with the Advanced Practitioner's note, impression, and recommendations with any updates as below.    Jared Sessler Mansouraty, MD  Gastroenterology Advanced Endoscopy Office # [phone]  

## 2020-06-08 ENCOUNTER — Encounter (HOSPITAL_COMMUNITY): Payer: Self-pay

## 2020-06-08 ENCOUNTER — Other Ambulatory Visit: Payer: Self-pay

## 2020-06-08 ENCOUNTER — Ambulatory Visit (HOSPITAL_COMMUNITY)
Admission: RE | Admit: 2020-06-08 | Discharge: 2020-06-08 | Disposition: A | Discharge status: Home / Self Care | Payer: Self-pay | Source: Ambulatory Visit | Attending: Nurse Practitioner | Admitting: Nurse Practitioner

## 2020-06-08 DIAGNOSIS — R109 Unspecified abdominal pain: Secondary | ICD-10-CM | POA: Insufficient documentation

## 2020-06-08 DIAGNOSIS — R1013 Epigastric pain: Secondary | ICD-10-CM | POA: Insufficient documentation

## 2020-06-08 MED ORDER — IOHEXOL 300 MG/ML  SOLN
100.0000 mL | Freq: Once | INTRAMUSCULAR | Status: AC | PRN
  Administered 2020-06-08: 100 mL via INTRAVENOUS

## 2020-06-10 ENCOUNTER — Other Ambulatory Visit: Payer: Self-pay | Admitting: General Surgery

## 2020-06-10 DIAGNOSIS — R748 Abnormal levels of other serum enzymes: Secondary | ICD-10-CM

## 2020-06-10 NOTE — Telephone Encounter (Signed)
-----   Message from Arnaldo Natal, NP sent at 06/04/2020 10:30 AM EDT ----- French Ana, pls call patient and let him know his lipase (pancreatic enzyme is a little elevated). Please provide him with a lab order to check a fasting Triglyceride level, lipase and amylase level in 1 week. Pt to proceed with CTAP with contrast and EGD as ordered. Thx

## 2020-06-10 NOTE — Telephone Encounter (Signed)
Left a voicemail for the patient to contact the office. We need to let him know his lipase is a bit elevalted and we would like him to return on 06/11/2020 for additional labs.

## 2020-06-11 ENCOUNTER — Other Ambulatory Visit (INDEPENDENT_AMBULATORY_CARE_PROVIDER_SITE_OTHER): Payer: Self-pay

## 2020-06-11 DIAGNOSIS — R748 Abnormal levels of other serum enzymes: Secondary | ICD-10-CM

## 2020-06-11 LAB — TRIGLYCERIDES: Triglycerides: 72 mg/dL (ref 0.0–149.0)

## 2020-06-11 LAB — LIPASE: Lipase: 67 U/L — ABNORMAL HIGH (ref 11.0–59.0)

## 2020-06-11 LAB — AMYLASE: Amylase: 96 U/L (ref 27–131)

## 2020-06-12 ENCOUNTER — Other Ambulatory Visit: Payer: Self-pay

## 2020-06-12 DIAGNOSIS — R748 Abnormal levels of other serum enzymes: Secondary | ICD-10-CM

## 2020-06-12 NOTE — Telephone Encounter (Signed)
Jared Holt, Jared Holt by CKS or myself in 6 weeks. Thanks. GM

## 2020-06-15 NOTE — Telephone Encounter (Signed)
Please let patient know he should return for labs - CMP/Lipase. Would recommend rescheduling procedures as had been the plan by CKS. Up to patient at this time. Thanks. GM

## 2020-06-16 ENCOUNTER — Other Ambulatory Visit (INDEPENDENT_AMBULATORY_CARE_PROVIDER_SITE_OTHER): Payer: Self-pay

## 2020-06-16 ENCOUNTER — Other Ambulatory Visit: Payer: Self-pay

## 2020-06-16 DIAGNOSIS — R748 Abnormal levels of other serum enzymes: Secondary | ICD-10-CM

## 2020-06-16 DIAGNOSIS — R1013 Epigastric pain: Secondary | ICD-10-CM

## 2020-06-16 LAB — COMPREHENSIVE METABOLIC PANEL
ALT: 62 U/L — ABNORMAL HIGH (ref 0–53)
AST: 30 U/L (ref 0–37)
Albumin: 4.4 g/dL (ref 3.5–5.2)
Alkaline Phosphatase: 48 U/L (ref 39–117)
BUN: 21 mg/dL (ref 6–23)
CO2: 28 mEq/L (ref 19–32)
Calcium: 9.4 mg/dL (ref 8.4–10.5)
Chloride: 103 mEq/L (ref 96–112)
Creatinine, Ser: 1.36 mg/dL (ref 0.40–1.50)
GFR: 64.57 mL/min (ref >60.00)
Glucose, Bld: 93 mg/dL (ref 70–99)
Potassium: 4.2 mEq/L (ref 3.5–5.1)
Sodium: 137 mEq/L (ref 135–145)
Total Bilirubin: 0.4 mg/dL (ref 0.2–1.2)
Total Protein: 7.7 g/dL (ref 6.0–8.3)

## 2020-06-18 ENCOUNTER — Encounter: Payer: Self-pay | Admitting: Gastroenterology

## 2020-06-19 ENCOUNTER — Other Ambulatory Visit: Payer: Self-pay

## 2020-06-19 ENCOUNTER — Other Ambulatory Visit (INDEPENDENT_AMBULATORY_CARE_PROVIDER_SITE_OTHER): Payer: Self-pay

## 2020-06-19 ENCOUNTER — Telehealth: Payer: Self-pay | Admitting: Nurse Practitioner

## 2020-06-19 DIAGNOSIS — R748 Abnormal levels of other serum enzymes: Secondary | ICD-10-CM

## 2020-06-19 DIAGNOSIS — R1013 Epigastric pain: Secondary | ICD-10-CM

## 2020-06-19 LAB — HEPATIC FUNCTION PANEL
ALT: 45 U/L (ref 0–53)
AST: 23 U/L (ref 0–37)
Albumin: 4.3 g/dL (ref 3.5–5.2)
Alkaline Phosphatase: 57 U/L (ref 39–117)
Bilirubin, Direct: 0.1 mg/dL (ref 0.0–0.3)
Total Bilirubin: 0.4 mg/dL (ref 0.2–1.2)
Total Protein: 7.6 g/dL (ref 6.0–8.3)

## 2020-06-19 LAB — LIPASE: Lipase: 70 U/L — ABNORMAL HIGH (ref 11.0–59.0)

## 2020-06-19 NOTE — Telephone Encounter (Signed)
Pt has pain in the upper part of his stomach, he is requesting some medication for the pain because he does not want to go over the weekend feeling like that. Pt is also waiting for lab results that he can see in Mychart.

## 2020-06-19 NOTE — Telephone Encounter (Signed)
Spoke with pt see alternate phone note 10/1

## 2020-06-22 LAB — HEPATITIS C ANTIBODY
Hepatitis C Ab: NONREACTIVE
SIGNAL TO CUT-OFF: 0.03 (ref <1.00)

## 2020-06-22 LAB — HEPATITIS B SURFACE ANTIGEN: Hepatitis B Surface Ag: NONREACTIVE

## 2020-06-22 LAB — HEPATITIS B CORE ANTIBODY, TOTAL: Hep B Core Total Ab: NONREACTIVE

## 2020-06-22 LAB — HEPATITIS A ANTIBODY, TOTAL: Hepatitis A AB,Total: REACTIVE — AB

## 2020-06-25 ENCOUNTER — Other Ambulatory Visit: Payer: Self-pay

## 2020-06-25 MED ORDER — SUCRALFATE 1 GM/10ML PO SUSP: 1.0000 g | Freq: Three times a day (TID) | ORAL | 1 refills | Status: DC

## 2020-06-25 MED ORDER — PANTOPRAZOLE SODIUM 40 MG PO TBEC: 40.0000 mg | DELAYED_RELEASE_TABLET | Freq: Two times a day (BID) | ORAL | 3 refills | Status: DC

## 2020-06-25 NOTE — Telephone Encounter (Signed)
Patty or covering RN, I have replied to the patient's my chart message. Recommend that we either increase the Protonix to 40 twice daily and see how he does versus addition of Carafate liquid suspension 1 g 2-4 times daily. Also recommend that patient undergo the previously discussed diagnostic endoscopy although financially I am not sure if he can afford to have this done. We will see what he can do. Can be offered appointment with NP [facility] as able.  Please reach out to him as able.  GM

## 2020-07-14 ENCOUNTER — Telehealth: Payer: Self-pay

## 2020-07-14 NOTE — Telephone Encounter (Signed)
Dr. Meridee Score, I called this patient and offered an office visit with Noel Gerold on 07/27/20 (her first available). Your first available was in Dec.,  and he states he is starting a new job on 07/27/20 in [place] and will be out of town for 1 month. You stated you would like him to see one of the 2 of you, he is requesting to see any app. Before 07/27/20. Do you want me to schedule him to see one of them ? Please advise.

## 2020-07-14 NOTE — Telephone Encounter (Signed)
Scheduled patient for office visit with Willette Cluster NP on 07/17/20. Sent patient MyChart message with appt.

## 2020-07-14 NOTE — Telephone Encounter (Signed)
Left message for patient to please call back. 

## 2020-07-14 NOTE — Telephone Encounter (Signed)
Not sure if someone has availability but that can happen if there is an opening. Thanks. GM

## 2020-07-17 ENCOUNTER — Ambulatory Visit (INDEPENDENT_AMBULATORY_CARE_PROVIDER_SITE_OTHER): Payer: Self-pay | Admitting: Nurse Practitioner

## 2020-07-17 ENCOUNTER — Encounter: Payer: Self-pay | Admitting: Nurse Practitioner

## 2020-07-17 ENCOUNTER — Other Ambulatory Visit: Payer: Self-pay

## 2020-07-17 VITALS — BP 108/70 | HR 66 | Ht 69.0 in | Wt 184.0 lb

## 2020-07-17 DIAGNOSIS — R1010 Upper abdominal pain, unspecified: Secondary | ICD-10-CM

## 2020-07-17 NOTE — Progress Notes (Signed)
ASSESSMENT AND PLAN    # Upper abdominal pain of unclear etiology. With exception of a mildly elevated lipase and mildly elevated ALT his labs have been unremarkable. No hepatobiliary pathology on [place] or CT scan with contrast. Pancreas appeared normal on CT scan.  --Patient wasn't able to have EGD due to lack of care partner. Given negative workup to date he needs an EGD. Patient understands he must have a care partner for the procedure. . There is a company who supplies care partners for a fee and we will provide him with contact information.  The risks and benefits of EGD were discussed and the patient agrees to proceed.   # Mildly elevated ALT --Hepatitis serologies negative.  --No liver abnormalities on CT scan   HISTORY OF PRESENT ILLNESS     Primary Gastroenterologist : Corliss Parish, MD Chief Complaint : abdominal pain  Jared Holt is a 59 y.o. male with PMH / PSH significant for,  but not necessarily limited to:  Glaucoma, HTN, pancreatitis, hyperlipidemia.   Patient was seen by Alcide Evener, PA mid-September for evaluation of abdominal pain, primarily in the epigastric region.  He had an ultrasound in July which was negative for hepatobiliary pathology.  Prior labs were normal.  H. pylori breath test was negative.  Patient gave a history of pancreatitis in 2014.  We repeated labs, ordered a CT scan of the abdomen and pelvis with contrast. We added dicyclomine as needed and recommended an EGD.  His colon cancer screening was up-to-date, last performed in spring 2021 in [place]. Jill Side has already requested those recoreds  Patient's lipase returned slightly elevated at 70.  ALT was mildly elevated at 62, remainder of liver chemistries unremarkable. Called him back for additional labs. Fasting triglyceride level was normal.  Hepatitis serologies negative. Repeat lipase was 67, amylase 96.  CT scan of the abdomen and pelvis with contrast negative for any acute  abnormalities. Pancreas appeared normal. Patient was not able to get the EGD to lack of a care partner.  Interval History:  Patient here with ongoing upper abdominal pain. The pain waxes and wanes. It is dull, "tender". It migrates at times from epigastrium to both the right and left upper quadrants. Pain gets worse with certain foods such as apples, corn, and plantains but present even if he hasn't recently eaten. Marland Kitchen   He says the pain started in June or July. No significant use of NSAIDs prior to onset of pain.  He tried to change his diet. Stopped flomax because he thought that could be the cause. He is taking BID PPI but says he really isn't sure how much is has helped. Bentyl didn't help so he didn't ask for a refill.   Patient brings in a paper with labs results. He went to a testing center on [address] and asked for certain tests to be drawn. On 07/15/20 his triglycerides were 71, lipase 65   Past Medical History:  Diagnosis Date  . Cataract   . Glaucoma   . High cholesterol   . Hypertension   . Pancreatitis     Current Medications, Allergies, Past Surgical History, Family History and Social History were reviewed in [employer] record.   Current Outpatient Medications  Medication Sig Dispense Refill  . dicyclomine (BENTYL) 10 MG capsule Take 1 capsule (10 mg total) by mouth every 8 (eight) hours. 30 capsule 0  . ezetimibe (ZETIA) 10 MG tablet Take 1 tablet (10 mg  total) by mouth daily. 180 tablet 1  . hydrocortisone (ANUSOL-HC) 25 MG suppository Place 1 suppository (25 mg total) rectally 2 (two) times daily. (Patient taking differently: Place 25 mg rectally as needed. ) 12 suppository 0  . latanoprost (XALATAN) 0.005 % ophthalmic solution Place 1 drop into both eyes at bedtime.    Marland Kitchen lisinopril (ZESTRIL) 20 MG tablet Take 1 tablet (20 mg total) by mouth daily. 180 tablet 1  . pantoprazole (PROTONIX) 40 MG tablet Take 1 tablet (40 mg total) by mouth daily.  30 tablet 1  . pantoprazole (PROTONIX) 40 MG tablet Take 1 tablet (40 mg total) by mouth 2 (two) times daily. 60 tablet 3  . pravastatin (PRAVACHOL) 40 MG tablet Take 1 tablet (40 mg total) by mouth daily. 90 tablet 3  . sucralfate (CARAFATE) 1 GM/10ML suspension Take 10 mLs (1 g total) by mouth 4 (four) times daily -  with meals and at bedtime. 420 mL 1  . tadalafil (CIALIS) 5 MG tablet Take 1 tablet (5 mg total) by mouth every other day as needed for erectile dysfunction. 40 tablet 0  . tamsulosin (FLOMAX) 0.4 MG CAPS capsule Take by mouth.    . VOLTAREN 1 % GEL APPLY 2 GRAMS TOPICALLY 4  TIMES DAILY (Patient not taking: Reported on 06/02/2020) 800 g 0   No current facility-administered medications for this visit.    Review of Systems: No chest pain. No shortness of breath. No urinary complaints.   PHYSICAL EXAM :    Wt Readings from Last 3 Encounters:  06/02/20 184 lb 6 oz (83.6 kg)  04/10/20 185 lb (83.9 kg)  09/06/17 179 lb 9.6 oz (81.5 kg)    BP 108/70   Pulse 66   Ht 5\' 9"  (1.753 m)   Wt 184 lb (83.5 kg)   SpO2 95%   BMI 27.17 kg/m  Constitutional:  Pleasant male in no acute distress. Psychiatric: Normal mood and affect. Behavior is normal. EENT: Pupils normal.  Conjunctivae are normal. No scleral icterus. Neck supple.  Cardiovascular: Normal rate, regular rhythm. No edema Pulmonary/chest: Effort normal and breath sounds normal. No wheezing, rales or rhonchi. Abdominal: Soft, nondistended, mild epigastric and mid upper abdominal tenderness. Mild diastasis recti.  Bowel sounds active throughout. There are no masses palpable. No hepatomegaly. Musculoskeletal :  Negative Carnett's sign. No chest wall / rib discomfort to palpation.  Neurological: Alert and oriented to person place and time. Skin: Skin is warm and dry. No rashes noted.  , NP  07/17/2020, 10:54 AM

## 2020-07-17 NOTE — Progress Notes (Signed)
Attending Physician's Attestation   I have reviewed the chart.   I agree with the Advanced Practitioner's note, impression, and recommendations with any updates as below.    Vianca Bracher Mansouraty, MD [facility] Office # [phone]  

## 2020-07-17 NOTE — Patient Instructions (Signed)
If you are age 60 or older, your body mass index should be between 23-30. Your Body mass index is 27.17 kg/m. If this is out of the aforementioned range listed, please consider follow up with your Primary Care Provider.  If you are age 2 or younger, your body mass index should be between 19-25. Your Body mass index is 27.17 kg/m. If this is out of the aformentioned range listed, please consider follow up with your Primary Care Provider.   You have been scheduled for an endoscopy. Please follow written instructions given to you at your visit today. If you use inhalers (even only as needed), please bring them with you on the day of your procedure.  Due to recent changes in healthcare laws, you may see the results of your imaging and laboratory studies on MyChart before your provider has had a chance to review them.  We understand that in some cases there may be results that are confusing or concerning to you. Not all laboratory results come back in the same time frame and the provider may be waiting for multiple results in order to interpret others.  Please give [place] 48 hours in order for your provider to thoroughly review all the results before contacting the office for clarification of your results.   Thank you for entrusting me with your care and choosing [facility].  Willette Cluster, NP

## 2020-07-20 ENCOUNTER — Ambulatory Visit (INDEPENDENT_AMBULATORY_CARE_PROVIDER_SITE_OTHER): Payer: Self-pay | Admitting: Family Medicine

## 2020-07-20 ENCOUNTER — Other Ambulatory Visit: Payer: Self-pay

## 2020-07-20 VITALS — BP 122/60 | HR 62 | Ht 70.0 in | Wt 184.4 lb

## 2020-07-20 DIAGNOSIS — K649 Unspecified hemorrhoids: Secondary | ICD-10-CM

## 2020-07-20 DIAGNOSIS — R1013 Epigastric pain: Secondary | ICD-10-CM

## 2020-07-20 DIAGNOSIS — I10 Essential (primary) hypertension: Secondary | ICD-10-CM

## 2020-07-20 DIAGNOSIS — E785 Hyperlipidemia, unspecified: Secondary | ICD-10-CM

## 2020-07-20 MED ORDER — EZETIMIBE 10 MG PO TABS: 10.0000 mg | ORAL_TABLET | Freq: Every day | ORAL | 1 refills | Status: AC

## 2020-07-20 MED ORDER — HYDROCORTISONE ACETATE 25 MG RE SUPP: 25.0000 mg | Freq: Two times a day (BID) | RECTAL | 2 refills | Status: DC

## 2020-07-20 NOTE — Progress Notes (Signed)
   Subjective:   Patient ID: Jared Holt    DOB: 11-05-1960, 59 y.o. male   MRN: [id number]  [name] is a 59 y.o. male with a history of hypertension, eustachian tube dysfunction, epigastric abdominal pain, erectile dysfunction, hyperlipidemia, low back pain, neutropenia, risky sexual behavior, thrombocytopenia here for cholesterol follow-up.  HTN:  BP: 122/60 today. Currently on Lisinopril 20mg  QD. Endorses compliance. Non-smoker. Denies any chest pain, SOB, vision changes, or headaches.    HLD: Last lipid panel below. Currently on Pravastatin 40mg  QD. Endorses compliance. Denies any muscles aches or weakness. The 10-year ASCVD risk score DC Jr., et al., 2013) is: 12.3% He was on Zetia but had to discontinue due to cost issues with lack of insurance. He is interested in restarting and should be obtaining insurance within the next few months.   Lab Results  Component Value Date   CHOL 166 09/16/2016   HDL 39 (L) 09/16/2016   LDLCALC 112 (H) 09/16/2016   TRIG 72.0 06/11/2020   CHOLHDL 4.3 09/16/2016    Review of Systems:  Per HPI.   Objective:   BP 122/60   Pulse 62   Ht 5\' 10"  (1.778 m)   Wt 184 lb 6.4 oz (83.6 kg)   SpO2 100%   BMI 26.46 kg/m  Vitals and nursing note reviewed.  General: pleasant older gentleman, sitting comfortably in exam chair, well nourished, well developed, in no acute distress with non-toxic appearance Resp: breathing comfortably on room air, speaking in full sentences MSK: gait normal Neuro: Alert and oriented, speech normal  Assessment & Plan:   Benign essential HTN Chronic. Well controlled. Continue Lisinopril 20mg  QD.  HLD (hyperlipidemia) Chronic. Patient brought in records of most recent lipid panel.  Last lipid panel: 07/14/20: Chol 180, HDL 38, Trig 71, LDL 125 - continue Pravastatin 40mg  QD - Restart Zetia. GoodRx coupon for affordable rx provided.  - follow up 3-6 months for repeat LDL to monitor effectiveness and  compliance  Abdominal pain, epigastric Chronic. Follows with GI with plan for endoscopy on 07/23/20.  Chronically mildly elevated lipase level.   Health Maintenance: - due for colonoscopy and flu vaccine  - recommend discussion at follow up visit   Meds ordered this encounter  Medications  . hydrocortisone (ANUSOL-HC) 25 MG suppository    Sig: Place 1 suppository (25 mg total) rectally 2 (two) times daily.    Dispense:  12 suppository    Refill:  2  . ezetimibe (ZETIA) 10 MG tablet    Sig: Take 1 tablet (10 mg total) by mouth daily.    Dispense:  90 tablet    Refill:  1    , DO PGY-3, [facility] 07/21/2020 8:43 PM

## 2020-07-21 ENCOUNTER — Encounter: Payer: Self-pay | Admitting: Family Medicine

## 2020-07-21 DIAGNOSIS — R748 Abnormal levels of other serum enzymes: Secondary | ICD-10-CM | POA: Insufficient documentation

## 2020-07-21 NOTE — Patient Instructions (Signed)
It was a pleasure to see you today!  Thank you for choosing [facility] for your primary care.  Jared Holt was seen for high cholesterol  Our plans for today were:  Continue Pravastatin  Restart zetia  Continue lisinopril  To keep you healthy, please keep in mind the following health maintenance items that you are due for:   1. Colonoscpy 2.  Flu    You should return to our clinic in 3-6 months for follow up   Best Wishes,   Orpah Cobb, DO

## 2020-07-21 NOTE — Assessment & Plan Note (Signed)
Chronic. Patient brought in records of most recent lipid panel.  Last lipid panel: 07/14/20: Chol 180, HDL 38, Trig 71, LDL 125 - continue Pravastatin 40mg  QD - Restart Zetia. GoodRx coupon for affordable rx provided.  - follow up 3-6 months for repeat LDL to monitor effectiveness and compliance

## 2020-07-21 NOTE — Assessment & Plan Note (Signed)
Chronic. Follows with GI with plan for endoscopy on 07/23/20.  Chronically mildly elevated lipase level.

## 2020-07-21 NOTE — Assessment & Plan Note (Signed)
Chronic. Well controlled. Continue Lisinopril 20mg  QD.

## 2020-07-22 NOTE — Addendum Note (Signed)
Addended by: Joana Reamer on: 07/22/2020 08:28 PM   Modules accepted: Level of Service

## 2020-07-23 ENCOUNTER — Other Ambulatory Visit: Payer: Self-pay

## 2020-07-23 ENCOUNTER — Ambulatory Visit (AMBULATORY_SURGERY_CENTER): Payer: Self-pay | Admitting: Gastroenterology

## 2020-07-23 ENCOUNTER — Encounter: Payer: Self-pay | Admitting: Gastroenterology

## 2020-07-23 VITALS — BP 135/79 | HR 51 | Temp 96.8°F | Resp 19 | Ht 69.0 in | Wt 184.0 lb

## 2020-07-23 DIAGNOSIS — K3189 Other diseases of stomach and duodenum: Secondary | ICD-10-CM

## 2020-07-23 DIAGNOSIS — R1010 Upper abdominal pain, unspecified: Secondary | ICD-10-CM

## 2020-07-23 DIAGNOSIS — K319 Disease of stomach and duodenum, unspecified: Secondary | ICD-10-CM

## 2020-07-23 MED ORDER — SODIUM CHLORIDE 0.9 % IV SOLN: 500.0000 mL | INTRAVENOUS | Status: DC

## 2020-07-23 NOTE — Progress Notes (Signed)
Patient ID: Jared Holt, male   DOB: [birth year], 60 y.o.   MRN: [id number]   Pt vitals remained stable. Removed monitor. Awaiting pt to speak with MD regarding results. Pt is awake and alert and ready for discharge

## 2020-07-23 NOTE — Progress Notes (Signed)
robinol antisialogogue lidocaine buffer °

## 2020-07-23 NOTE — Progress Notes (Signed)
V/s Cw

## 2020-07-23 NOTE — Progress Notes (Signed)
A and O x3. Report to RN. Tolerated MAC anesthesia well.Teeth unchanged after procedure.

## 2020-07-23 NOTE — Progress Notes (Signed)
Patient ID: Jared Holt, male   DOB: 06-01-1961, 60 y.o.   MRN: [id number]   Pt remains in recovery. His vitals are stable. Waiting to speak MD about test results. Pt is more awake at this time but requests to stay laying down.

## 2020-07-23 NOTE — Discharge Instructions (Signed)
Resume previous medications. Handouts on findings given to patient. Await pathology for final recommendations.  YOU HAD AN ENDOSCOPIC PROCEDURE TODAY AT THE [facility]:   Refer to the procedure report that was given to you for any specific questions about what was found during the examination.  If the procedure report does not answer your questions, please call your gastroenterologist to clarify.  If you requested that your care partner not be given the details of your procedure findings, then the procedure report has been included in a sealed envelope for you to review at your convenience later.  YOU SHOULD EXPECT: Some feelings of bloating in the abdomen. Passage of more gas than usual.  Walking can help get rid of the air that was put into your GI tract during the procedure and reduce the bloating. If you had a lower endoscopy (such as a colonoscopy or flexible sigmoidoscopy) you may notice spotting of blood in your stool or on the toilet paper. If you underwent a bowel prep for your procedure, you may not have a normal bowel movement for a few days.  Please Note:  You might notice some irritation and congestion in your nose or some drainage.  This is from the oxygen used during your procedure.  There is no need for concern and it should clear up in a day or so.  SYMPTOMS TO REPORT IMMEDIATELY:   Following upper endoscopy (EGD)  Vomiting of blood or coffee ground material  New chest pain or pain under the shoulder blades  Painful or persistently difficult swallowing  New shortness of breath  Fever of 100F or higher  Black, tarry-looking stools  For urgent or emergent issues, a gastroenterologist can be reached at any hour by calling [phone]. Do not use MyChart messaging for urgent concerns.    DIET:  We do recommend a small meal at first, but then you may proceed to your regular diet.  Drink plenty of fluids but you should avoid alcoholic beverages for 24  hours.  ACTIVITY:  You should plan to take it easy for the rest of today and you should NOT DRIVE or use heavy machinery until tomorrow (because of the sedation medicines used during the test).    FOLLOW UP: Our staff will call the number listed on your records 48-72 hours following your procedure to check on you and address any questions or concerns that you may have regarding the information given to you following your procedure. If we do not reach you, we will leave a message.  We will attempt to reach you two times.  During this call, we will ask if you have developed any symptoms of COVID 19. If you develop any symptoms (ie: fever, flu-like symptoms, shortness of breath, cough etc.) before then, please call [phone].  If you test positive for Covid 19 in the 2 weeks post procedure, please call and report this information to us.    If any biopsies were taken you will be contacted by phone or by letter within the next 1-3 weeks.  Please call us at [phone] if you have not heard about the biopsies in 3 weeks.    SIGNATURES/CONFIDENTIALITY: You and/or your care partner have signed paperwork which will be entered into your electronic medical record.  These signatures attest to the fact that that the information above on your After Visit Summary has been reviewed and is understood.  Full responsibility of the confidentiality of this discharge information lies with you and/or your   care-partner. 

## 2020-07-23 NOTE — Op Note (Signed)
[facility] Patient Name: Jared Holt Procedure Date: 07/23/2020 9:58 AM MRN: [id number] Endoscopist: Justice Britain , MD Age: 59 Referring MD:  Date of Birth: 09-Nov-1960 Gender: Male Account #: [id number] Procedure:                Upper GI endoscopy Indications:              Generalized abdominal pain Medicines:                Monitored Anesthesia Care Procedure:                Pre-Anesthesia Assessment:                           - Prior to the procedure, a History and Physical                            was performed, and patient medications and                            allergies were reviewed. The patient's tolerance of                            previous anesthesia was also reviewed. The risks                            and benefits of the procedure and the sedation                            options and risks were discussed with the patient.                            All questions were answered, and informed consent                            was obtained. Prior Anticoagulants: The patient has                            taken no previous anticoagulant or antiplatelet                            agents. ASA Grade Assessment: II - A patient with                            mild systemic disease. After reviewing the risks                            and benefits, the patient was deemed in                            satisfactory condition to undergo the procedure.                           After obtaining informed consent, the endoscope was  passed under direct vision. Throughout the                            procedure, the patient's blood pressure, pulse, and                            oxygen saturations were monitored continuously. The                            Endoscope was introduced through the mouth, and                            advanced to the second part of duodenum. The upper                            GI endoscopy was  accomplished without difficulty.                            The patient tolerated the procedure. Scope In: Scope Out: Findings:                 No gross lesions were noted in the entire                            esophagus. Biopsies were taken with a cold forceps                            for histology.                           The Z-line was regular and was found 41 cm from the                            incisors.                           No gross lesions were noted in the entire examined                            stomach. Biopsies were taken with a cold forceps                            for histology and Helicobacter pylori testing.                           No gross lesions were noted in the duodenal bulb,                            in the first portion of the duodenum and in the                            second portion of the duodenum. Biopsies for  histology were taken with a cold forceps for                            evaluation of celiac disease. Complications:            No immediate complications. Estimated Blood Loss:     Estimated blood loss was minimal. Impression:               - No gross lesions in esophagus. Biopsied.                           - Z-line regular, 41 cm from the incisors.                           - No gross lesions in the stomach. Biopsied.                           - No gross lesions in the duodenal bulb, in the                            first portion of the duodenum and in the second                            portion of the duodenum. Biopsied. Recommendation:           - The patient will be observed post-procedure,                            until all discharge criteria are met.                           - Discharge patient to home.                           - Patient has a contact number available for                            emergencies. The signs and symptoms of potential                            delayed  complications were discussed with the                            patient. Return to normal activities tomorrow.                            Written discharge instructions were provided to the                            patient.                           - Resume previous diet.                           - May decrease PPI to  once daily dosing.                           - Follow up pathology.                           - After results return will consider use of FDGard                            trial.                           - Will consider additional workup depending on                            patient's overall clinical status. Not clear that                            his slight elevation in lipase is truly coming from                            the pancreas. Query a MRI/MRCP at some point to                            potentially look at Pancreas even closer vs EUS                            though he has never had documented true                            pancreatitis on imaging.                           - Possible use of TCA in future for continued                            issues of abdominal pain if this turns out to be                            functional.                           - The findings and recommendations were discussed                            with the patient. Justice Britain, MD 07/23/2020 10:30:19 AM

## 2020-07-23 NOTE — Progress Notes (Signed)
Called to room to assist during endoscopic procedure.  Patient ID and intended procedure confirmed with present staff. Received instructions for my participation in the procedure from the performing physician.  

## 2020-07-27 ENCOUNTER — Telehealth: Payer: Self-pay | Admitting: *Deleted

## 2020-07-27 ENCOUNTER — Telehealth: Payer: Self-pay

## 2020-07-27 NOTE — Telephone Encounter (Signed)
No answer for post procedure call back. Left message for patient to call with questions or concerns. 

## 2020-07-27 NOTE — Telephone Encounter (Signed)
Attempted to reach patient for post-procedure f/u call. No answer. Left message that we will make another attempt to reach him later today and for him to please not hesitate to call us if he has any questions/concerns regarding his care. 

## 2020-07-30 ENCOUNTER — Telehealth: Payer: Self-pay

## 2020-07-30 ENCOUNTER — Encounter: Payer: Self-pay | Admitting: Gastroenterology

## 2020-07-30 NOTE — Telephone Encounter (Signed)
Sent copy of Dr. Elesa Hacker EGD result letter to patient via My Chart

## 2020-08-06 ENCOUNTER — Other Ambulatory Visit: Payer: Self-pay

## 2020-08-06 MED ORDER — DEXLANSOPRAZOLE 30 MG PO CPDR: 30.0000 mg | DELAYED_RELEASE_CAPSULE | Freq: Every day | ORAL | 3 refills | Status: DC

## 2020-10-20 ENCOUNTER — Other Ambulatory Visit: Payer: Self-pay | Admitting: Nurse Practitioner

## 2021-06-02 ENCOUNTER — Encounter (HOSPITAL_COMMUNITY): Payer: Self-pay

## 2021-06-02 ENCOUNTER — Ambulatory Visit (HOSPITAL_COMMUNITY)
Admission: EM | Admit: 2021-06-02 | Discharge: 2021-06-02 | Disposition: A | Discharge status: Home / Self Care | Payer: Self-pay | Attending: Medical Oncology | Admitting: Medical Oncology

## 2021-06-02 ENCOUNTER — Other Ambulatory Visit: Payer: Self-pay

## 2021-06-02 DIAGNOSIS — M62838 Other muscle spasm: Secondary | ICD-10-CM

## 2021-06-02 MED ORDER — TIZANIDINE HCL 4 MG PO TABS: 4.0000 mg | ORAL_TABLET | Freq: Four times a day (QID) | ORAL | 0 refills | Status: DC | PRN

## 2021-06-02 NOTE — ED Provider Notes (Signed)
[facility]    CSN: [id number] Arrival date & time: 06/02/21  4098      History   Chief Complaint Chief Complaint  Patient presents with   Neck Pain     HPI Jared Holt is a 60 y.o. male.   HPI  Neck Pain: Pt presents with neck pain.  He reports that he has had the neck pain for the past 2 weeks.  He reports that he had similar neck pain about 3 months ago which had an MRI with negative findings and was prescribed a medication that really helped him.  Upon reviewing his medical chart it appears that he was diagnosed with torticollis from [facility] around 3 months ago and was given Valium. Today he denies any new injury, MSK weakness, numbness/tingling in hands or feet. Pain feels similar but in a slightly different location. He has recently used warm compress and OTC muscle pain cream for symptoms with mild relief.    Past Medical History:  Diagnosis Date   Arthritis    knees   Cataract    Glaucoma    High cholesterol    Hypertension    Pancreatitis    Weakness     Patient Active Problem List   Diagnosis Date Noted   Elevated liver enzymes 04/04/2017   Abdominal pain, epigastric 04/04/2017   Anterior knee pain, right 11/22/2016   Neutropenia (HCC) 10/07/2016   Risky sexual behavior 05/12/2016   Thrombocytopenia (HCC) 09/08/2015   Erectile dysfunction 09/08/2015   Dysfunction of eustachian tube 10/08/2013   LBP (low back pain) 09/10/2013   HLD (hyperlipidemia) 04/15/2013   Benign essential HTN 04/15/2013    Past Surgical History:  Procedure Laterality Date   CATARACT EXTRACTION, BILATERAL         Home Medications    Prior to Admission medications   Medication Sig Start Date End Date Taking? Authorizing Provider  Dexlansoprazole 30 MG capsule Take 1 capsule (30 mg total) by mouth daily. 08/06/20   Mansouraty, Netty Starring., MD  ezetimibe (ZETIA) 10 MG tablet Take 1 tablet (10 mg total) by mouth daily. 07/20/20   Mullis, Kiersten P,  DO  hydrocortisone (ANUSOL-HC) 25 MG suppository Place 1 suppository (25 mg total) rectally 2 (two) times daily. 07/20/20   Mullis, Kiersten P, DO  latanoprost (XALATAN) 0.005 % ophthalmic solution Place 1 drop into both eyes at bedtime. 05/22/20   [provider]  lisinopril (ZESTRIL) 20 MG tablet Take 1 tablet (20 mg total) by mouth daily. 05/29/20   Maness, Loistine Chance, MD  pravastatin (PRAVACHOL) 40 MG tablet Take 1 tablet (40 mg total) by mouth daily. 05/29/20   Maness, Loistine Chance, MD  sucralfate (CARAFATE) 1 GM/10ML suspension Take 10 mLs (1 g total) by mouth 4 (four) times daily -  with meals and at bedtime. Patient not taking: Reported on 07/23/2020 06/25/20   Mansouraty, Netty Starring., MD  tadalafil (CIALIS) 5 MG tablet Take 1 tablet (5 mg total) by mouth every other day as needed for erectile dysfunction. Patient not taking: Reported on 07/23/2020 05/15/17   Almon Hercules, MD  tamsulosin (FLOMAX) 0.4 MG CAPS capsule Take by mouth. Patient not taking: Reported on 07/23/2020 08/08/19   [provider]    Family History Family History  Problem Relation Age of Onset   Heart failure Father    Diabetes Sister    Clotting disorder Brother    Other Brother        Covid 68   Diabetes Brother  Social History Social History   Tobacco Use   Smoking status: Former    Types: Cigarettes    Quit date: 2001    Years since quitting: 21.7   Smokeless tobacco: Never  Vaping Use   Vaping Use: Never used  Substance Use Topics   Alcohol use: Yes    Comment: social   Drug use: No     Allergies   Sulfamethoxazole, Statins, and Sulfa antibiotics   Review of Systems Review of Systems  As stated above in HPI Physical Exam Triage Vital Signs ED Triage Vitals  Enc Vitals Group     BP 06/02/21 1152 118/80     Pulse Rate 06/02/21 1152 (!) 52     Resp 06/02/21 1152 17     Temp 06/02/21 1152 98 F (36.7 C)     Temp Source 06/02/21 1152 Oral     SpO2 06/02/21 1152 100 %     Weight  --      Height --      Head Circumference --      Peak Flow --      Pain Score 06/02/21 1151 6     Pain Loc --      Pain Edu? --      Excl. in GC? --    No data found.  Updated Vital Signs BP 118/80 (BP Location: Left Arm)   Pulse (!) 52   Temp 98 F (36.7 C) (Oral)   Resp 17   SpO2 100%   Physical Exam Vitals and nursing note reviewed.  Constitutional:      Appearance: Normal appearance.  HENT:     Head: Normocephalic and atraumatic.     Comments: No lymphadenopathy Cardiovascular:     Rate and Rhythm: Normal rate and regular rhythm.     Heart sounds: Normal heart sounds.  Pulmonary:     Effort: Pulmonary effort is normal.     Breath sounds: Normal breath sounds.  Musculoskeletal:        General: Tenderness present. Normal range of motion.     Comments: There are palpable muscle spasms of the left trapezius muscle and SCM muscle in the area of concern and pain  Skin:    General: Skin is warm.  Neurological:     Mental Status: He is alert.     UC Treatments / Results  Labs (all labs ordered are listed, but only abnormal results are displayed) Labs Reviewed - No data to display  EKG   Radiology No results found.  Procedures Procedures (including critical care time)  Medications Ordered in UC Medications - No data to display  Initial Impression / Assessment and Plan / UC Course  I have reviewed the triage vital signs and the nursing notes.  Pertinent labs & imaging results that were available during my care of the patient were reviewed by me and considered in my medical decision making (see chart for details).     New.  Appears to be a muscle spasm.  Patient reports that he has a history of arthritis and I have recommended that he consider seeing a physical therapy as he continues to have flares of neck pain and muscle spasms.  We discussed tizanidine which is a more gentle muscle relaxer than the Ativan that he was given at [facility] especially given his  lower pulse rate which is chronic for patient.  We discussed red flag signs and symptoms. Final Clinical Impressions(s) / UC Diagnoses   Final diagnoses:  None  Discharge Instructions   None    ED Prescriptions   None    PDMP not reviewed this encounter.   Rushie Chestnut, [place] 06/02/21 1228

## 2021-06-02 NOTE — ED Triage Notes (Signed)
Pt presents with ongoing neck pain X 2 weeks; pt had similar neck pain 3 months ago, had a MRI with negative findings and prescribed medication that temporarily helped.

## 2021-06-10 ENCOUNTER — Encounter: Payer: Self-pay | Admitting: Family Medicine

## 2021-06-10 ENCOUNTER — Other Ambulatory Visit: Payer: Self-pay

## 2021-06-10 ENCOUNTER — Ambulatory Visit (INDEPENDENT_AMBULATORY_CARE_PROVIDER_SITE_OTHER): Payer: Self-pay | Admitting: Family Medicine

## 2021-06-10 DIAGNOSIS — M542 Cervicalgia: Secondary | ICD-10-CM

## 2021-06-10 MED ORDER — ACETAMINOPHEN ER 650 MG PO TBCR: 650.0000 mg | EXTENDED_RELEASE_TABLET | Freq: Four times a day (QID) | ORAL | 0 refills | Status: AC | PRN

## 2021-06-10 MED ORDER — BACLOFEN 20 MG PO TABS: 20.0000 mg | ORAL_TABLET | Freq: Three times a day (TID) | ORAL | 0 refills | Status: DC

## 2021-06-10 MED ORDER — DICLOFENAC SODIUM 1 % EX GEL: 2.0000 g | Freq: Every day | CUTANEOUS | 0 refills | Status: AC | PRN

## 2021-06-10 NOTE — Patient Instructions (Signed)
Thank you for coming to see me today. It was a pleasure. Today we discussed your neck pain. I think it is due to arthritis. I recommend tylenol 650mg  every 6-8 hrs, ibuprofen 400mg  every 8hrs as needed, voltaren gel etc. Stop the tizanidine and start baclofen which can help with muscle spasms.  Please follow-up with PCP if no improvement in symptoms. Can refer you back to the neurologist.   If you have any questions or concerns, please do not hesitate to call the office at [phone].  Best wishes,   Dr 

## 2021-06-10 NOTE — Progress Notes (Signed)
     SUBJECTIVE:   CHIEF COMPLAINT / HPI:   Jared Holt is a 60 y.o. male presents for left sided neck pain  Neck pain Pt presents for left sided neck pain. Seen at [facility] 3 months prior, diagnosed with left sided torticollis and given valium. Seen in Urgent care on 06/02/21 for left sided neck pain. Dx with muscle pain and discharged with tizanadine. Since then pt reports persistent symptoms and tizanadine makes him drowsy but reduces the pain a little. Has tried heat pads for the pain. Pt  radiation of pain, weakness or paraesthesias. The urgent care also referred him to PT. Denies fevers, weight loss, night sweats etc  Flowsheet Row Office Visit from 06/10/2021 in [facility]  PHQ-9 Total Score 1         PERTINENT  PMH / PSH: HTN, HLD, ED  OBJECTIVE:   BP 100/67   Pulse (!) 56   Ht 5\' 10"  (1.778 m)   Wt 191 lb 3.2 oz (86.7 kg)   SpO2 100%   BMI 27.43 kg/m    General: Alert, no acute distress HEENT: NCAT, no c-spine tenderness, left sided neck pain, no obvious swelling or deformities, no lymphadenopathy Cardio: well perfused  Pulm: normal work of breathing Neuro: Cranial nerves grossly intact, 5/5 strength upper and lower extremities, normal sensation throughout.  ASSESSMENT/PLAN:   Neck pain on left side Most likely due to cervical degenerative disease which was seen on x-rays earlier this year. Pt has been seen by neurology this year. Per their previous note they would consider MRI neck if persistent symptoms. Recommended supportive care for now: tylenol, motrin, voltaren gel for the pain. Recommended stopping tizanidine due to side effects. Prescribed baclofen instead for muscle spasm. Pt will follow up with PT. Follow up with PCP if persistent sx, can refer pt to neurology.    , MD PGY-3 [facility]

## 2021-06-11 ENCOUNTER — Other Ambulatory Visit: Payer: Self-pay | Admitting: Family Medicine

## 2021-06-11 DIAGNOSIS — E785 Hyperlipidemia, unspecified: Secondary | ICD-10-CM

## 2021-06-11 MED ORDER — PRAVASTATIN SODIUM 40 MG PO TABS
40.0000 mg | ORAL_TABLET | Freq: Every day | ORAL | 2 refills | Status: AC
Start: 2021-06-11 — End: ?

## 2021-06-12 DIAGNOSIS — M542 Cervicalgia: Secondary | ICD-10-CM | POA: Insufficient documentation

## 2021-06-12 NOTE — Assessment & Plan Note (Signed)
Most likely due to cervical degenerative disease which was seen on x-rays earlier this year. Pt has been seen by neurology this year. Per their previous note they would consider MRI neck if persistent symptoms. Recommended supportive care for now: tylenol, motrin, voltaren gel for the pain. Recommended stopping tizanidine due to side effects. Prescribed baclofen instead for muscle spasm. Pt will follow up with PT. Follow up with PCP if persistent sx, can refer pt to neurology.

## 2021-06-29 ENCOUNTER — Ambulatory Visit (INDEPENDENT_AMBULATORY_CARE_PROVIDER_SITE_OTHER): Payer: Self-pay | Admitting: Family Medicine

## 2021-06-29 ENCOUNTER — Other Ambulatory Visit: Payer: Self-pay

## 2021-06-29 ENCOUNTER — Encounter: Payer: Self-pay | Admitting: Family Medicine

## 2021-06-29 VITALS — BP 108/72 | HR 64 | Ht 70.0 in | Wt 191.4 lb

## 2021-06-29 DIAGNOSIS — M5412 Radiculopathy, cervical region: Secondary | ICD-10-CM

## 2021-06-29 DIAGNOSIS — I10 Essential (primary) hypertension: Secondary | ICD-10-CM

## 2021-06-29 MED ORDER — GABAPENTIN 100 MG PO CAPS
100.0000 mg | ORAL_CAPSULE | Freq: Three times a day (TID) | ORAL | 0 refills | Status: DC
Start: 2021-06-29 — End: 2022-02-04

## 2021-06-29 NOTE — Progress Notes (Signed)
    SUBJECTIVE:   CHIEF COMPLAINT / HPI:   Left sided neck pain- He notes this started 3 weeks ago after he got his COVID booster. No trauma or fall. He has pain from his L neck that radiates down his arm. Denies numbness or weakness of hand or arm. Tried tropical cream on arms and several muscle relaxers that did not help. Has had cervical X-rays showing degenerative changes previously. Also [place] brain [place] that was reported as normal although I cannot view the MRI. He does not currently have insurance but is going to [place] for several months and will have insurance there.  PERTINENT  PMH / PSH: HTN  OBJECTIVE:   BP 108/72   Pulse 64   Ht 5\' 10"  (1.778 m)   Wt 191 lb 6.4 oz (86.8 kg)   SpO2 99%   BMI 27.46 kg/m   General: alert & oriented, no apparent distress, well groomed HEENT: normocephalic, atraumatic, EOM grossly intact, oral mucosa moist, neck supple, no swelling of neck, TTP of left cervical paraspinal muscles, no cervical spine tenderness Respiratory: normal respiratory effort GI: non-distended Skin: no rashes, no jaundice Psych: appropriate mood and affect Neuro: CN II-XII intact, strength and sensation intact in bilateral upper and lower extremities   ASSESSMENT/PLAN:   Cervical radiculopathy - symptoms consistent with cervical radiculopathy, no neuro symptoms like numbness or weakness - Discussed PT vs MRI, patient currently without insurance but will have next week in , discussed follow up with doctor there to recommend MRI - in meantime can trial low dose gabapentin (he cannot due steroids due to his history of glaucoma), RFP appropriate will trial 100mg  qHS if tolerated can do 100mg  TID - discussed red flag signs/symptoms  Benign essential HTN - normotensive today, continue home meds, repeat BMP was normal as it has been one year since lab work      [place], MD [facility]

## 2021-06-29 NOTE — Patient Instructions (Addendum)
It was wonderful to see you today.  Please bring ALL of your medications with you to every visit.   Today we talked about:  - I would recommend ordering an MRI for your cervical spine, please let us know when you have insurance to order this - Checking renal function today - Start gabapentin 100mg  at night, if not too drowsy can take it up to three times a day for nerve pain - Schedule follow up in 1 month to reassess neck pain   Thank you for choosing [facility].   Please call [phone] with any questions about today's appointment.  Please be sure to schedule follow up at the front  desk before you leave today.   [phone], MD  Family Medicine

## 2021-06-30 LAB — BASIC METABOLIC PANEL
BUN/Creatinine Ratio: 14 (ref 10–24)
BUN: 17 mg/dL (ref 8–27)
CO2: 24 mmol/L (ref 20–29)
Calcium: 9.1 mg/dL (ref 8.6–10.2)
Chloride: 102 mmol/L (ref 96–106)
Creatinine, Ser: 1.22 mg/dL (ref 0.76–1.27)
Glucose: 113 mg/dL — ABNORMAL HIGH (ref 70–99)
Potassium: 4.1 mmol/L (ref 3.5–5.2)
Sodium: 140 mmol/L (ref 134–144)
eGFR: 67 mL/min/{1.73_m2} (ref >59)

## 2021-07-01 DIAGNOSIS — M5412 Radiculopathy, cervical region: Secondary | ICD-10-CM | POA: Insufficient documentation

## 2021-07-01 NOTE — Assessment & Plan Note (Signed)
-   normotensive today, continue home meds, repeat BMP was normal as it has been one year since lab work

## 2021-07-01 NOTE — Assessment & Plan Note (Signed)
-   symptoms consistent with cervical radiculopathy, no neuro symptoms like numbness or weakness - Discussed PT vs MRI, patient currently without insurance but will have next week in [place], discussed follow up with doctor there to recommend MRI - in meantime can trial low dose gabapentin (he cannot due steroids due to his history of glaucoma), RFP appropriate will trial 100mg  qHS if tolerated can do 100mg  TID - discussed red flag signs/symptoms

## 2021-07-23 ENCOUNTER — Encounter: Payer: Self-pay | Admitting: Family Medicine

## 2021-07-26 ENCOUNTER — Other Ambulatory Visit: Payer: Self-pay | Admitting: Family Medicine

## 2021-07-26 DIAGNOSIS — M542 Cervicalgia: Secondary | ICD-10-CM

## 2021-07-27 ENCOUNTER — Encounter: Payer: Self-pay | Admitting: Family Medicine

## 2021-07-27 DIAGNOSIS — I10 Essential (primary) hypertension: Secondary | ICD-10-CM

## 2021-07-28 ENCOUNTER — Encounter: Payer: Self-pay | Admitting: Family Medicine

## 2021-07-28 MED ORDER — LISINOPRIL 20 MG PO TABS: 20.0000 mg | ORAL_TABLET | Freq: Every day | ORAL | 1 refills | Status: DC

## 2021-07-30 NOTE — Telephone Encounter (Signed)
Patient calls nurse line checking the status of MRI scheduling.   Patient scheduled for 11/21 @2 :30pm at [facility].   I attempted to call patient back to inform however no answer. Will send a mychart message.

## 2021-08-09 ENCOUNTER — Other Ambulatory Visit: Payer: Self-pay

## 2021-08-09 ENCOUNTER — Ambulatory Visit (HOSPITAL_COMMUNITY)
Admission: RE | Admit: 2021-08-09 | Discharge: 2021-08-09 | Disposition: A | Discharge status: Home / Self Care | Payer: BC Managed Care – PPO | Source: Ambulatory Visit | Attending: Family Medicine | Admitting: Family Medicine

## 2021-08-09 DIAGNOSIS — M542 Cervicalgia: Secondary | ICD-10-CM | POA: Diagnosis not present

## 2021-08-10 ENCOUNTER — Other Ambulatory Visit: Payer: Self-pay | Admitting: Family Medicine

## 2021-08-10 DIAGNOSIS — M4802 Spinal stenosis, cervical region: Secondary | ICD-10-CM

## 2021-08-10 DIAGNOSIS — M5412 Radiculopathy, cervical region: Secondary | ICD-10-CM

## 2021-08-10 NOTE — Progress Notes (Signed)
Referral to neurosurgery to consider injections vs surgery

## 2021-09-06 ENCOUNTER — Other Ambulatory Visit: Payer: Self-pay | Admitting: Family Medicine

## 2021-09-06 ENCOUNTER — Ambulatory Visit: Payer: BC Managed Care – PPO | Admitting: Family Medicine

## 2021-09-06 ENCOUNTER — Encounter: Payer: Self-pay | Admitting: Family Medicine

## 2021-09-06 ENCOUNTER — Ambulatory Visit (INDEPENDENT_AMBULATORY_CARE_PROVIDER_SITE_OTHER): Payer: BC Managed Care – PPO

## 2021-09-06 ENCOUNTER — Other Ambulatory Visit: Payer: Self-pay

## 2021-09-06 ENCOUNTER — Ambulatory Visit: Payer: BC Managed Care – PPO | Admitting: Student

## 2021-09-06 ENCOUNTER — Telehealth: Payer: Self-pay

## 2021-09-06 VITALS — BP 137/73 | HR 59 | Ht 70.0 in | Wt 197.1 lb

## 2021-09-06 DIAGNOSIS — R748 Abnormal levels of other serum enzymes: Secondary | ICD-10-CM

## 2021-09-06 DIAGNOSIS — E785 Hyperlipidemia, unspecified: Secondary | ICD-10-CM

## 2021-09-06 DIAGNOSIS — Z23 Encounter for immunization: Secondary | ICD-10-CM | POA: Diagnosis not present

## 2021-09-06 DIAGNOSIS — I10 Essential (primary) hypertension: Secondary | ICD-10-CM

## 2021-09-06 MED ORDER — LISINOPRIL 20 MG PO TABS
20.0000 mg | ORAL_TABLET | Freq: Every day | ORAL | 1 refills | Status: DC
Start: 2021-09-06 — End: 2022-02-04

## 2021-09-06 NOTE — Patient Instructions (Signed)
Thank you for coming to see me today. It was a pleasure.   We will get some labs today.  If they are abnormal or we need to do something about them, I will call you.  If they are normal, I will send you a message on MyChart (if it is active) or a letter in the mail.  If you don't hear from [place] in 2 weeks, please call the office at the number below.   Please follow-up with PCP 3 months  If you have any questions or concerns, please do not hesitate to call the office at [phone].  Best,   Dana Allan, MD

## 2021-09-06 NOTE — Telephone Encounter (Signed)
Patient calls nurse line reporting concerns with blood pressure medication. Patient reports he has been taking lisinopril 20mg , however went out of town to and the dosage was changed at an urgent care there. Patient reports he went to UC due to running out of medication, however they prescribed him lisinopril 10mg  and he did not notice. Patient reports for about 1 month he was only taking 10mg  and noticed fatigue and headaches.   Patient reports he would like to discuss this with a provider. Patient denies SOB or chest pains. Patient scheduled for this morning for evaluation.

## 2021-09-06 NOTE — Progress Notes (Signed)
° ° °  SUBJECTIVE:   CHIEF COMPLAINT / HPI: blood pressure medication management  Was out of state and ran out of medications. Went to urgent care and had meds reordered.  Was doing ok for about 1 month, then noticed increase in headaches.  Noticed that Lisinopril was 10 mg had been prescribed instead of the usual dose, 20 mg daily.  Started to double up on meds and felt better.  Denies any chest pain, SOB, nausea./vomiting or visual changes.  PERTINENT  PMH / PSH:  HTN HLD Elevated liver enzymes  OBJECTIVE:   BP 137/73    Pulse (!) 59    Ht 5\' 10"  (1.778 m)    Wt 197 lb 2 oz (89.4 kg)    SpO2 100%    BMI 28.28 kg/m    General: Alert, no acute distress Cardio: Normal S1 and S2, RRR, no r/m/g Pulm: CTAB, normal work of breathing Abdomen: Bowel sounds normal. Abdomen soft and non-tender.  Extremities: No peripheral edema.    ASSESSMENT/PLAN:   Benign essential HTN Restart Lisinopril 20 mg daily Follow up with PCP as needed  HLD (hyperlipidemia) Continue Zetia and Lipitor Lipid panel today Follow up with PCP  Elevated liver enzymes CMet today.  No abdominal complaints Follow up with PCP     , MD [facility]

## 2021-09-07 ENCOUNTER — Encounter: Payer: Self-pay | Admitting: Family Medicine

## 2021-09-07 LAB — COMPREHENSIVE METABOLIC PANEL
ALT: 24 IU/L (ref 0–44)
AST: 22 IU/L (ref 0–40)
Albumin/Globulin Ratio: 2 (ref 1.2–2.2)
Albumin: 4.5 g/dL (ref 3.8–4.8)
Alkaline Phosphatase: 69 IU/L (ref 44–121)
BUN/Creatinine Ratio: 15 (ref 10–24)
BUN: 14 mg/dL (ref 8–27)
Bilirubin Total: 0.3 mg/dL (ref 0.0–1.2)
CO2: 23 mmol/L (ref 20–29)
Calcium: 9.1 mg/dL (ref 8.6–10.2)
Chloride: 102 mmol/L (ref 96–106)
Creatinine, Ser: 0.96 mg/dL (ref 0.76–1.27)
Globulin, Total: 2.3 g/dL (ref 1.5–4.5)
Glucose: 95 mg/dL (ref 70–99)
Potassium: 4.3 mmol/L (ref 3.5–5.2)
Sodium: 140 mmol/L (ref 134–144)
Total Protein: 6.8 g/dL (ref 6.0–8.5)
eGFR: 90 mL/min/{1.73_m2} (ref >59)

## 2021-09-07 LAB — LIPASE: Lipase: 32 U/L (ref 13–78)

## 2021-09-07 LAB — LIPID PANEL
Chol/HDL Ratio: 4.2 ratio (ref 0.0–5.0)
Cholesterol, Total: 165 mg/dL (ref 100–199)
HDL: 39 mg/dL — ABNORMAL LOW (ref >39)
LDL Chol Calc (NIH): 107 mg/dL — ABNORMAL HIGH (ref 0–99)
Triglycerides: 102 mg/dL (ref 0–149)
VLDL Cholesterol Cal: 19 mg/dL (ref 5–40)

## 2021-09-12 ENCOUNTER — Encounter: Payer: Self-pay | Admitting: Family Medicine

## 2021-09-12 NOTE — Assessment & Plan Note (Signed)
CMet today.  No abdominal complaints Follow up with PCP

## 2021-09-12 NOTE — Assessment & Plan Note (Signed)
Restart Lisinopril 20 mg daily Follow up with PCP as needed

## 2021-09-12 NOTE — Assessment & Plan Note (Signed)
Continue Zetia and Lipitor Lipid panel today Follow up with PCP

## 2021-09-15 DIAGNOSIS — M47812 Spondylosis without myelopathy or radiculopathy, cervical region: Secondary | ICD-10-CM | POA: Diagnosis not present

## 2021-09-15 DIAGNOSIS — Z6828 Body mass index (BMI) 28.0-28.9, adult: Secondary | ICD-10-CM | POA: Diagnosis not present

## 2021-12-08 ENCOUNTER — Other Ambulatory Visit: Payer: Self-pay

## 2021-12-08 ENCOUNTER — Ambulatory Visit (HOSPITAL_COMMUNITY)
Admission: EM | Admit: 2021-12-08 | Discharge: 2021-12-08 | Disposition: A | Discharge status: Home / Self Care | Payer: BC Managed Care – PPO

## 2021-12-08 ENCOUNTER — Encounter (HOSPITAL_COMMUNITY): Payer: Self-pay

## 2021-12-08 ENCOUNTER — Emergency Department (HOSPITAL_COMMUNITY)
Admission: EM | Admit: 2021-12-08 | Discharge: 2021-12-09 | Disposition: A | Discharge status: Home / Self Care | Payer: BC Managed Care – PPO | Attending: Emergency Medicine | Admitting: Emergency Medicine

## 2021-12-08 ENCOUNTER — Encounter (HOSPITAL_COMMUNITY): Payer: Self-pay | Admitting: Emergency Medicine

## 2021-12-08 DIAGNOSIS — Z79899 Other long term (current) drug therapy: Secondary | ICD-10-CM | POA: Insufficient documentation

## 2021-12-08 DIAGNOSIS — Z859 Personal history of malignant neoplasm, unspecified: Secondary | ICD-10-CM | POA: Diagnosis not present

## 2021-12-08 DIAGNOSIS — M25519 Pain in unspecified shoulder: Secondary | ICD-10-CM | POA: Diagnosis not present

## 2021-12-08 DIAGNOSIS — M542 Cervicalgia: Secondary | ICD-10-CM | POA: Diagnosis not present

## 2021-12-08 DIAGNOSIS — R220 Localized swelling, mass and lump, head: Secondary | ICD-10-CM

## 2021-12-08 DIAGNOSIS — R519 Headache, unspecified: Secondary | ICD-10-CM | POA: Diagnosis not present

## 2021-12-08 DIAGNOSIS — K0889 Other specified disorders of teeth and supporting structures: Secondary | ICD-10-CM | POA: Insufficient documentation

## 2021-12-08 DIAGNOSIS — T7840XA Allergy, unspecified, initial encounter: Secondary | ICD-10-CM

## 2021-12-08 DIAGNOSIS — G500 Trigeminal neuralgia: Secondary | ICD-10-CM | POA: Diagnosis not present

## 2021-12-08 DIAGNOSIS — G44209 Tension-type headache, unspecified, not intractable: Secondary | ICD-10-CM | POA: Diagnosis not present

## 2021-12-08 MED ORDER — KETOROLAC TROMETHAMINE 15 MG/ML IJ SOLN
15.0000 mg | Freq: Once | INTRAMUSCULAR | Status: AC
Start: 2021-12-08 — End: 2021-12-08
  Administered 2021-12-08: 15 mg via INTRAMUSCULAR
  Filled 2021-12-08: qty 1

## 2021-12-08 MED ORDER — ACETAMINOPHEN 325 MG PO TABS
650.0000 mg | ORAL_TABLET | Freq: Once | ORAL | Status: AC
  Administered 2021-12-08: 650 mg via ORAL
  Filled 2021-12-08: qty 2

## 2021-12-08 NOTE — ED Provider Notes (Signed)
?[facility] ? ? ? ?CSN: [id number] ?Arrival date & time: 12/08/21  1016 ? ? ?  ? ?History   ?Chief Complaint ?Chief Complaint  ?Patient presents with  ? Facial Swelling  ? ? ?HPI ?Jared Holt is a 61 y.o. male.  ? ?Pt complains of feeling like his face is swollen.  Pt reports his nose looks swollen.  Pt also feels like his forehead is swollen.  Pt denies sinus congestion or pressure.  No fever no chills,  no sore throat.  Pt started famotidine 1 week ago for reflux and increased gas.  ? ?The history is provided by the patient. No language interpreter was used.  ? ?Past Medical History:  ?Diagnosis Date  ? Arthritis   ? knees  ? Cataract   ? Glaucoma   ? High cholesterol   ? Hypertension   ? Pancreatitis   ? Weakness   ? ? ?Patient Active Problem List  ? Diagnosis Date Noted  ? Cervical radiculopathy 07/01/2021  ? Neck pain on left side 06/12/2021  ? Elevated liver enzymes 04/04/2017  ? Abdominal pain, epigastric 04/04/2017  ? Anterior knee pain, right 11/22/2016  ? Neutropenia (HCC) 10/07/2016  ? Risky sexual behavior 05/12/2016  ? Thrombocytopenia (HCC) 09/08/2015  ? Erectile dysfunction 09/08/2015  ? Dysfunction of eustachian tube 10/08/2013  ? LBP (low back pain) 09/10/2013  ? HLD (hyperlipidemia) 04/15/2013  ? Benign essential HTN 04/15/2013  ? ? ?Past Surgical History:  ?Procedure Laterality Date  ? CATARACT EXTRACTION, BILATERAL    ? ? ? ? ? ?Home Medications   ? ?Prior to Admission medications   ?Medication Sig Start Date End Date Taking? Authorizing Provider  ?diclofenac Sodium (VOLTAREN) 1 % GEL Apply 2 g topically daily as needed. 06/10/21   Towanda Octave, MD  ?ezetimibe (ZETIA) 10 MG tablet Take 1 tablet (10 mg total) by mouth daily. 07/20/20   Mullis, Kiersten P, DO  ?gabapentin (NEURONTIN) 100 MG capsule Take 1 capsule (100 mg total) by mouth 3 (three) times daily. 06/29/21   Billey Co, MD  ?hydrocortisone (ANUSOL-HC) 25 MG suppository Place 1 suppository (25 mg total) rectally 2 (two)  times daily. 07/20/20   Mullis, Kiersten P, DO  ?latanoprost (XALATAN) 0.005 % ophthalmic solution Place 1 drop into both eyes at bedtime. 05/22/20   [provider]  ?lisinopril (ZESTRIL) 20 MG tablet Take 1 tablet (20 mg total) by mouth daily. 09/06/21   Dana Allan, MD  ?pravastatin (PRAVACHOL) 40 MG tablet Take 1 tablet (40 mg total) by mouth daily. 06/11/21   Simmons-Robinson, Tawanna Cooler, MD  ?sucralfate (CARAFATE) 1 GM/10ML suspension Take 10 mLs (1 g total) by mouth 4 (four) times daily -  with meals and at bedtime. ?Patient not taking: No sig reported 06/25/20   Mansouraty, Netty Starring., MD  ?tadalafil (CIALIS) 5 MG tablet Take 1 tablet (5 mg total) by mouth every other day as needed for erectile dysfunction. ?Patient not taking: No sig reported 05/15/17   Almon Hercules, MD  ? ? ?Family History ?Family History  ?Problem Relation Age of Onset  ? Heart failure Father   ? Diabetes Sister   ? Clotting disorder Brother   ? Other Brother   ?     Covid 19  ? Diabetes Brother   ? ? ?Social History ?Social History  ? ?Tobacco Use  ? Smoking status: Former  ?  Types: Cigarettes  ?  Quit date: 2001  ?  Years since quitting: 22.2  ?  Smokeless tobacco: Never  ?Vaping Use  ? Vaping Use: Never used  ?Substance Use Topics  ? Alcohol use: Yes  ?  Comment: social  ? Drug use: No  ? ? ? ?Allergies   ?Sulfamethoxazole, Statins, and Sulfa antibiotics ? ? ?Review of Systems ?Review of Systems  ?All other systems reviewed and are negative. ? ? ?Physical Exam ?Triage Vital Signs ?ED Triage Vitals [12/08/21 1242]  ?Enc Vitals Group  ?   BP (!) 172/98  ?   Pulse Rate (!) 56  ?   Resp 20  ?   Temp 98.4 ?F (36.9 ?C)  ?   Temp Source Oral  ?   SpO2 94 %  ?   Weight   ?   Height   ?   Head Circumference   ?   Peak Flow   ?   Pain Score 0  ?   Pain Loc   ?   Pain Edu?   ?   Excl. in GC?   ? ?No data found. ? ?Updated Vital Signs ?BP (!) 151/93 (BP Location: Left Arm)   Pulse (!) 56   Temp 98.4 ?F (36.9 ?C) (Oral)   Resp 20   SpO2 94%   ? ?Visual Acuity ?Right Eye Distance:   ?Left Eye Distance:   ?Bilateral Distance:   ? ?Right Eye Near:   ?Left Eye Near:    ?Bilateral Near:    ? ?Physical Exam ?Vitals and nursing note reviewed.  ?Constitutional:   ?   Appearance: He is well-developed.  ?HENT:  ?   Head: Normocephalic.  ?   Comments: No obvious facial swelling,  no obvious edema,   ?   Nose: Congestion present.  ?   Mouth/Throat:  ?   Pharynx: Oropharynx is clear.  ?Eyes:  ?   Extraocular Movements: Extraocular movements intact.  ?   Pupils: Pupils are equal, round, and reactive to light.  ?Cardiovascular:  ?   Rate and Rhythm: Normal rate.  ?Pulmonary:  ?   Effort: Pulmonary effort is normal.  ?Abdominal:  ?   General: There is no distension.  ?Musculoskeletal:     ?   General: Normal range of motion.  ?   Cervical back: Normal range of motion.  ?Neurological:  ?   General: No focal deficit present.  ?   Mental Status: He is alert and oriented to person, place, and time.  ?Psychiatric:     ?   Mood and Affect: Mood normal.  ? ? ? ?UC Treatments / Results  ?Labs ?(all labs ordered are listed, but only abnormal results are displayed) ?Labs Reviewed - No data to display ? ?EKG ? ? ?Radiology ?No results found. ? ?Procedures ?Procedures (including critical care time) ? ?Medications Ordered in UC ?Medications - No data to display ? ?Initial Impression / Assessment and Plan / UC Course  ?I have reviewed the triage vital signs and the nursing notes. ? ?Pertinent labs & imaging results that were available during my care of the patient were reviewed by me and considered in my medical decision making (see chart for details). ? ?  ? ?MDM:  I thinks symptoms most likely allergies,  I counseled pt on angioedema second to lisinopril.  I doubt reaction to famotidine  ?[facility] requesting follow uip ?Final Clinical Impressions(s) / UC Diagnoses  ? ?Final diagnoses:  ?Facial swelling  ?Allergy, initial encounter  ? ?Discharge Instructions   ?None ?   ? ?ED  Prescriptions   ?None ?  ? ?PDMP not reviewed this encounter. ?  ?Elson Areas, PA-C ?12/08/21 1353 ? ?

## 2021-12-08 NOTE — Discharge Instructions (Addendum)
Please read and follow all provided instructions. ? ?Your diagnoses today include:  ?1. Acute nonintractable headache, unspecified headache type   ? ? ?Tests performed today include: ?Vital signs. See below for your results today.  ? ?Medications:  ?In the Emergency Department you received: ?Toradol - NSAID medication similar to ibuprofen ? ?Take any prescribed medications only as directed. ? ?Additional information:  ?Follow any educational materials contained in this packet. ? ?You are having a headache. No specific cause was found today for your headache. It may have been a migraine or other cause of headache. Stress, anxiety, fatigue, and depression are common triggers for headaches.  ? ?Your headache today does not appear to be life-threatening or require hospitalization, but often the exact cause of headaches is not determined in the emergency department. Therefore, follow-up with your doctor is very important to find out what may have caused your headache and whether or not you need any further diagnostic testing or treatment.  ? ?Sometimes headaches can appear benign (not harmful), but then more serious symptoms can develop which should prompt an immediate re-evaluation by your doctor or the emergency department. ? ?BE VERY CAREFUL not to take multiple medicines containing Tylenol (also called acetaminophen). Doing so can lead to an overdose which can damage your liver and cause liver failure and possibly death.  ? ?Follow-up instructions: ?Please follow-up with your primary care provider in the next 3 days for further evaluation of your symptoms.  ? ?Continue to take Benadryl if you feel like your face is swelling, or over-the-counter Claritin also called loratadine.  This medication works similarly but will make you as sleepy. ? ?Return instructions:  ?Please return to the Emergency Department if you experience worsening symptoms. ?Return if the medications do not resolve your headache, if it recurs, or if  you have multiple episodes of vomiting or cannot keep down fluids. ?Return if you have a change from the usual headache. ?RETURN IMMEDIATELY IF you: ?Develop a sudden, severe headache ?Develop confusion or become poorly responsive or faint ?Develop a fever above 100.42F or problem breathing ?Have a change in speech, vision, swallowing, or understanding ?Develop new weakness, numbness, tingling, incoordination in your arms or legs ?Have a seizure ?Please return if you have any other emergent concerns. ? ?Additional Information: ? ?Your vital signs today were: ?BP (!) 168/89   Pulse 70   Temp (!) 97.4 ?F (36.3 ?C) (Oral)   Resp 16   Ht 5\' 10"  (1.778 m)   Wt 89.4 kg   SpO2 96%   BMI 28.28 kg/m?  ?If your blood pressure (BP) was elevated above 135/85 this visit, please have this repeated by your doctor within one month. ?-------------- ? ?

## 2021-12-08 NOTE — Discharge Instructions (Signed)
Benadryl 25 mg every 4 hours  

## 2021-12-08 NOTE — ED Provider Notes (Signed)
?[facility] ?Provider Note ? ? ?CSN: [id number] ?Arrival date & time: 12/08/21  2135 ? ?  ? ?History ? ?Chief Complaint  ?Patient presents with  ? Headache  ? ? ?Jared Holt is a 61 y.o. male. ? ?Patient presents to the emergency department for evaluation of headache.  Patient has had symptoms over the past day.  He is on lisinopril daily.  He feels as though his face was swollen.  No lip swelling, tongue swelling.  Pain is a pressure type pain.  It does not radiate.  No confusion, vomiting, neck pain or fevers.  He denies head injuries.  Patient was seen at urgent care earlier today for the same symptoms.  He was started on Benadryl and asked to follow-up with his PCP.  He has taken 2 doses of Benadryl without improvement, prompting emergency department visit.  Otherwise no chest pain, shortness of breath, vomiting, diarrhea. ? ? ?  ? ?Home Medications ?Prior to Admission medications   ?Medication Sig Start Date End Date Taking? Authorizing Provider  ?diclofenac Sodium (VOLTAREN) 1 % GEL Apply 2 g topically daily as needed. 06/10/21   Towanda OctavePatel, Poonam, MD  ?ezetimibe (ZETIA) 10 MG tablet Take 1 tablet (10 mg total) by mouth daily. 07/20/20   Mullis, Kiersten P, DO  ?gabapentin (NEURONTIN) 100 MG capsule Take 1 capsule (100 mg total) by mouth 3 (three) times daily. 06/29/21   Billey CoPray, Margaret E, MD  ?hydrocortisone (ANUSOL-HC) 25 MG suppository Place 1 suppository (25 mg total) rectally 2 (two) times daily. 07/20/20   Mullis, Kiersten P, DO  ?latanoprost (XALATAN) 0.005 % ophthalmic solution Place 1 drop into both eyes at bedtime. 05/22/20   [provider]  ?lisinopril (ZESTRIL) 20 MG tablet Take 1 tablet (20 mg total) by mouth daily. 09/06/21   Dana AllanWalsh, Tanya, MD  ?pravastatin (PRAVACHOL) 40 MG tablet Take 1 tablet (40 mg total) by mouth daily. 06/11/21   Simmons-Robinson, Tawanna CoolerMakiera, MD  ?sucralfate (CARAFATE) 1 GM/10ML suspension Take 10 mLs (1 g total) by mouth 4 (four) times  daily -  with meals and at bedtime. ?Patient not taking: No sig reported 06/25/20   Mansouraty, Netty StarringGabriel Jr., MD  ?tadalafil (CIALIS) 5 MG tablet Take 1 tablet (5 mg total) by mouth every other day as needed for erectile dysfunction. ?Patient not taking: No sig reported 05/15/17   Almon HerculesGonfa, Taye T, MD  ?   ? ?Allergies    ?Sulfamethoxazole, Statins, and Sulfa antibiotics   ? ?Review of Systems   ?Review of Systems ? ?Physical Exam ?Updated Vital Signs ?BP (!) 168/89   Pulse 70   Temp (!) 97.4 ?F (36.3 ?C) (Oral)   Resp 16   Ht 5\' 10"  (1.778 m)   Wt 89.4 kg   SpO2 96%   BMI 28.28 kg/m?  ?Physical Exam ?Vitals and nursing note reviewed.  ?Constitutional:   ?   Appearance: He is well-developed.  ?HENT:  ?   Head: Normocephalic and atraumatic.  ?   Comments: No obvious edema to the forehead, face or jaw.  If any, it is very mild. ?   Right Ear: Tympanic membrane, ear canal and external ear normal.  ?   Left Ear: Tympanic membrane, ear canal and external ear normal.  ?   Nose: Nose normal. No nasal tenderness or congestion.  ?   Right Turbinates: Not enlarged or swollen.  ?   Left Turbinates: Not enlarged or swollen.  ?   Right Sinus: No maxillary sinus  tenderness or frontal sinus tenderness.  ?   Left Sinus: No maxillary sinus tenderness or frontal sinus tenderness.  ?   Mouth/Throat:  ?   Pharynx: Uvula midline.  ?   Comments: No angioedema noted, no posterior pharyngeal swelling.  ?Eyes:  ?   General: Lids are normal.  ?   Conjunctiva/sclera: Conjunctivae normal.  ?   Pupils: Pupils are equal, round, and reactive to light.  ?Neck:  ?   Comments: Full range of motion of the neck without signs of meningismus. ?Cardiovascular:  ?   Rate and Rhythm: Normal rate and regular rhythm.  ?Pulmonary:  ?   Effort: Pulmonary effort is normal.  ?   Breath sounds: Normal breath sounds.  ?Abdominal:  ?   Palpations: Abdomen is soft.  ?   Tenderness: There is no abdominal tenderness.  ?Musculoskeletal:     ?   General: Normal range  of motion.  ?   Cervical back: Normal range of motion and neck supple. No tenderness or bony tenderness.  ?Skin: ?   General: Skin is warm and dry.  ?Neurological:  ?   Mental Status: He is alert and oriented to person, place, and time.  ?   GCS: GCS eye subscore is 4. GCS verbal subscore is 5. GCS motor subscore is 6.  ?   Cranial Nerves: No cranial nerve deficit.  ?   Sensory: No sensory deficit.  ?   Motor: No abnormal muscle tone.  ?   Coordination: Coordination normal.  ?   Gait: Gait normal.  ?   Deep Tendon Reflexes: Reflexes are normal and symmetric.  ? ? ?ED Results / Procedures / Treatments   ?Labs ?(all labs ordered are listed, but only abnormal results are displayed) ?Labs Reviewed - No data to display ? ?EKG ?None ? ?Radiology ?No results found. ? ?Procedures ?Procedures  ? ? ?Medications Ordered in ED ?Medications  ?ketorolac (TORADOL) 15 MG/ML injection 15 mg (15 mg Intramuscular Given 12/08/21 2300)  ?acetaminophen (TYLENOL) tablet 650 mg (650 mg Oral Given 12/08/21 2300)  ? ? ?ED Course/ Medical Decision Making/ A&P ?  ? ?Patient seen and examined. History obtained directly from patient.  Reviewed urgent care notes. ? ?Medications/Fluids: Ordered: Offered Toradol and Tylenol.  Patient agrees. ? ?Most recent vital signs reviewed and are as follows: ?BP (!) 168/89   Pulse 70   Temp (!) 97.4 ?F (36.3 ?C) (Oral)   Resp 16   Ht 5\' 10"  (1.778 m)   Wt 89.4 kg   SpO2 96%   BMI 28.28 kg/m?  ? ?Initial impression: Headache, no focal neurodeficits. ? ? ? ?Reassessment performed. Patient appears comfortable, stable.  States that headache is improved.  Minimal facial swelling is not progressing. ? ?Reviewed pertinent lab work and imaging with patient at bedside. Questions answered.  ? ?Most current vital signs reviewed and are as follows: ?BP 138/72 (BP Location: Right Arm)   Pulse 66   Temp 97.7 ?F (36.5 ?C) (Oral)   Resp 16   Ht 5\' 10"  (1.778 m)   Wt 89.4 kg   SpO2 100%   BMI 28.28 kg/m?  ? ?Plan:  Discharge to home.  ? ?Other home care instructions discussed: Encouraged continuation of oral Benadryl if swelling continues. ? ?ED return instructions discussed: Return to the emergency department immediately if he develops lip, tongue swelling or difficulty breathing.   ? ?Follow-up instructions discussed: Patient encouraged to follow-up with their PCP in 3 days.  ? ? ? ? ? ?                        ?  Medical Decision Making ?Risk ?OTC drugs. ?Prescription drug management. ? ? ?In regards to the patient's headache, critical differentials were considered including subarachnoid hemorrhage, intracerebral hemorrhage, epidural/subdural hematoma, pituitary apoplexy, vertebral/carotid artery dissection, giant cell arteritis, central venous thrombosis, reversible cerebral vasoconstriction, acute angle closure glaucoma, idiopathic intracranial hypertension, bacterial meningitis, viral encephalitis, carbon monoxide poisoning, posterior reversible encephalopathy syndrome, pre-eclampsia.  ? ?Reg flag symptoms related to these causes were considered including systemic symptoms (fever, weight loss), neurologic symptoms (confusion, mental status change, vision change, associated seizure), acute or sudden "thunderclap" onset, patient of any age with first headache or change in headache pattern, pregnant or postpartum status, history of HIV or other immunocompromise, history of cancer, headache occurring with exertion, associated neck or shoulder pain, associated traumatic injury, concurrent use of anticoagulation, family history of spontaneous SAH, and concurrent drug use.   ? ?Other benign, more common causes of headache were considered including migraine, tension-type headache, cluster headache, referred pain from other cause such as sinus infection, dental pain, trigeminal neuralgia.  ? ?On exam, patient has a reassuring neuro exam including baseline mental status, no significant neck pain or meningeal signs, no signs of severe  infection or fever.  ? ?The patient's vital signs, pertinent lab work and imaging were reviewed and interpreted as discussed in the ED course. Hospitalization was considered for further testing, treatments, or serial exams/

## 2021-12-08 NOTE — ED Triage Notes (Signed)
Pt arrive POV from home for c/o HA, HTN and facial swollen that started today when he woke up at 5:30 am. Pt is AO x 4 during triage, NAD noticed. ?

## 2021-12-08 NOTE — ED Triage Notes (Signed)
Pt c/o swelling to noise and above eye brows since yesterday. Denies nasal congestion, states having sinus pressure.  ? ?Pt c/o urinary frequency x3-4 days.  ?

## 2021-12-09 NOTE — ED Notes (Signed)
Patient verbalizes understanding of d/c instructions. Opportunities for questions and answers were provided. Pt d/c from ED and ambulated to lobby.  

## 2022-01-03 ENCOUNTER — Encounter: Payer: Self-pay | Admitting: Family Medicine

## 2022-01-11 NOTE — Progress Notes (Signed)
[facility] ?Telemedicine Visit ? ?Patient consented to have virtual visit and was identified by name and date of birth. ?Method of visit: Telephone ? ?Encounter participants: ?Patient: Jared Holt - located at home ?Provider: Towanda Octave - located at [facility] ?Others (if applicable):  ? ?Chief Complaint: frequent urination ? ?HPI: ? ?Urinary sx ?Pt reports urinary frequency and nocturia for the last few years. He was put on Flomax by his old urologist back in [place]. He took it for a year but stopped taking it to due to stomach sx. 3 months ago he developed frequent urination again and it worsened. He was started back flomax by his PCP. Requesting urology referral.  He frequently travels for work to [place] and is there now. He is back on 15th May. Denies back pain, fevers vomiting, abdominal distension, constipation, diarrhea, penile lesions, penile discharge, dysuria, urgency or hematuria.  Denies being sexually active.  ? ? ?ROS: per HPI ? ?Pertinent PMHx: HTN, thrombocytopenia  ? ?Exam:  ?There were no vitals taken for this visit.  ?Respiratory: speaking in full sentences  ? ?Assessment/Plan: ? ?BPH (benign prostatic hyperplasia) ?On going symptoms of BPH. Low suspicion for UTI/pyelonephritis. Increased flomax to 0.8mg  and referred to urology. ?  ? ?Time spent during visit with patient: 11 minutes  ?

## 2022-01-12 ENCOUNTER — Telehealth (INDEPENDENT_AMBULATORY_CARE_PROVIDER_SITE_OTHER): Payer: BC Managed Care – PPO | Admitting: Family Medicine

## 2022-01-12 ENCOUNTER — Encounter: Payer: Self-pay | Admitting: Family Medicine

## 2022-01-12 DIAGNOSIS — N401 Enlarged prostate with lower urinary tract symptoms: Secondary | ICD-10-CM

## 2022-01-12 DIAGNOSIS — R350 Frequency of micturition: Secondary | ICD-10-CM

## 2022-01-12 MED ORDER — TAMSULOSIN HCL 0.4 MG PO CAPS: 0.8000 mg | ORAL_CAPSULE | Freq: Every day | ORAL | 0 refills | Status: DC

## 2022-01-15 DIAGNOSIS — N400 Enlarged prostate without lower urinary tract symptoms: Secondary | ICD-10-CM | POA: Insufficient documentation

## 2022-01-15 NOTE — Assessment & Plan Note (Addendum)
On going symptoms of BPH. Low suspicion for UTI/pyelonephritis. Increased flomax to 0.8mg  and referred to urology. ?

## 2022-01-17 ENCOUNTER — Encounter: Payer: Self-pay | Admitting: Family Medicine

## 2022-02-04 ENCOUNTER — Encounter: Payer: Self-pay | Admitting: Family Medicine

## 2022-02-04 ENCOUNTER — Ambulatory Visit (INDEPENDENT_AMBULATORY_CARE_PROVIDER_SITE_OTHER): Payer: BC Managed Care – PPO | Admitting: Family Medicine

## 2022-02-04 VITALS — BP 100/65 | HR 66 | Temp 98.1°F | Wt 198.4 lb

## 2022-02-04 DIAGNOSIS — R1013 Epigastric pain: Secondary | ICD-10-CM | POA: Diagnosis not present

## 2022-02-04 DIAGNOSIS — R350 Frequency of micturition: Secondary | ICD-10-CM

## 2022-02-04 DIAGNOSIS — R7989 Other specified abnormal findings of blood chemistry: Secondary | ICD-10-CM | POA: Diagnosis not present

## 2022-02-04 DIAGNOSIS — K120 Recurrent oral aphthae: Secondary | ICD-10-CM

## 2022-02-04 DIAGNOSIS — I10 Essential (primary) hypertension: Secondary | ICD-10-CM | POA: Diagnosis not present

## 2022-02-04 DIAGNOSIS — N401 Enlarged prostate with lower urinary tract symptoms: Secondary | ICD-10-CM

## 2022-02-04 MED ORDER — DICYCLOMINE HCL 10 MG PO CAPS: 20.0000 mg | ORAL_CAPSULE | Freq: Three times a day (TID) | ORAL | 0 refills | Status: DC

## 2022-02-04 MED ORDER — LISINOPRIL 20 MG PO TABS: 40.0000 mg | ORAL_TABLET | Freq: Every day | ORAL | 1 refills | Status: AC

## 2022-02-04 MED ORDER — MAGIC MOUTHWASH W/LIDOCAINE: 5.0000 mL | Freq: Four times a day (QID) | ORAL | 0 refills | Status: AC | PRN

## 2022-02-04 NOTE — Progress Notes (Signed)
SUBJECTIVE:   CHIEF COMPLAINT / HPI: abdominal pain and frequent urination   Abdominal Pain  Patient was evaluated in ED for abdominal pain. Abdominal pelvic CT was unremarkable. CMP notable for creatinine of 1.3.  Otherwise he had normal liver enzymes.  Today patient states that he does not have any abdominal pain.  He reports that he normally has the pain in the mornings when he wakes up.  Pain is localized to the epigastric region.  He states that he has daily bowel movements.  He denies any hematochezia or melena.  He denies difficult to pass stools.  He states that he takes Nexium 30 minutes before meals and this helps to keep the pain down.  If he does not take this medication regularly, the pain persists.  He denies any vomiting.  Patient states that without taking medication he can go 2 to 3 days without having any pain.  He states that pain is somewhat improved after having a bowel movement.  He reports that he has seen gastroenterology in the past and had an EGD that was unremarkable other than some mild irritation.   Frequent Urination  Patient reports that he would like to be seen by urology again.  He states that he was previously prescribed Flomax but noticed that it increases his urination frequency.  He states that he has stopped taking this and the urinary frequency has reduced.  He says he only wakes 1 time per night to use the restroom.  He reports that in a 4-hour.  At work he goes to the restroom once or twice now that he is stopped the medication.  He states that he would like to return for urology evaluation.  He has previously been diagnosed with BPH.  He denies any hematuria  Mouth Sore  Patient reports that he has had a sore on the right side of his cheek for 1 week.  He reports that the pain is similar to when he bit his cheek before.  He thinks that this started after accidentally biting his cheek.  He has been trying to keep the area clean with Listerine rinses 3 times  per day.  This does not help with this pain.  He states that pain is mostly with trying to chew.    PERTINENT  PMH / PSH:  Chronic Abdominal Pain  BPH    OBJECTIVE:   BP 100/65   Pulse 66   Temp 98.1 F (36.7 C)   Wt 198 lb 6.4 oz (90 kg)   SpO2 98%   BMI 28.47 kg/m   Physical Exam Vitals reviewed.  Constitutional:      General: He is not in acute distress.    Appearance: Normal appearance. He is not ill-appearing, toxic-appearing or diaphoretic.  HENT:     Head: Normocephalic and atraumatic.     Right Ear: Tympanic membrane and external ear normal.     Left Ear: Tympanic membrane and external ear normal.     Nose: Nose normal.     Mouth/Throat:     Mouth: Mucous membranes are moist.     Tongue: No lesions. Tongue does not deviate from midline.     Pharynx: No oropharyngeal exudate or posterior oropharyngeal erythema.     Comments: Area of erythema on right buccal surface, measuring 2cm  Eyes:     Extraocular Movements: Extraocular movements intact.     Conjunctiva/sclera: Conjunctivae normal.     Pupils: Pupils are equal, round, and reactive to  light.  Cardiovascular:     Rate and Rhythm: Normal rate and regular rhythm.     Pulses: Normal pulses.     Heart sounds: Normal heart sounds. No murmur heard. Pulmonary:     Effort: Pulmonary effort is normal.     Breath sounds: Normal breath sounds. No wheezing, rhonchi or rales.  Abdominal:     General: Abdomen is flat. There is no distension.     Tenderness: There is no abdominal tenderness. There is no guarding.  Musculoskeletal:        General: No swelling, tenderness or signs of injury. Normal range of motion.     Cervical back: Normal range of motion and neck supple.  Skin:    General: Skin is warm and dry.     Findings: No erythema or rash.  Neurological:     Mental Status: He is alert and oriented to person, place, and time.     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

## 2022-02-04 NOTE — Assessment & Plan Note (Signed)
Physical exam appears to be consistent with aphthous ulcer We will prescribe lidocaine mouthwash to be used 4 times daily as needed We will follow-up on healing of this ulcer when patient follows up for abdominal pain If persists, patient may benefit from biopsy

## 2022-02-04 NOTE — Assessment & Plan Note (Signed)
Patient has stopped taking Flomax and reports decreased urinary frequency Reviewed recent urine analysis from ED visit with no abnormalities noted at that time Will refer to urology per patient request

## 2022-02-04 NOTE — Assessment & Plan Note (Signed)
We will check CMP today Blood pressure well controlled Continue lisinopril 40 mg daily

## 2022-02-04 NOTE — Patient Instructions (Signed)
Please continue to take your 40 mg of lisinopril daily to control your blood pressure.  Your blood pressure is well controlled today.  For your abdominal pain, I have prescribed a medication called Bentyl to take with meals and at bedtime.  Please follow-up with me in 2-3 weeks to check on the effect of this medicine.  You can continue to take your Nexium twice daily.  For your mouth sore, I recommend using the mouthwash that I have prescribed to help with reducing any pain.  If this area of soreness continues despite using this mouthwash and over-the-counter Orajel, we can discuss this further at your follow-up appointment.

## 2022-02-04 NOTE — Assessment & Plan Note (Signed)
No abdominal pain today Abdominal pain similar to IBS Will start trial of Bentyl 20 mg 4 times daily Patient to follow-up in 2-3 weeks to assess medication efficacy Patient will continue PPI, Nexium twice daily with meals

## 2022-02-05 LAB — COMPREHENSIVE METABOLIC PANEL
ALT: 30 IU/L (ref 0–44)
AST: 23 IU/L (ref 0–40)
Albumin/Globulin Ratio: 1.5 (ref 1.2–2.2)
Albumin: 4.5 g/dL (ref 3.8–4.8)
Alkaline Phosphatase: 51 IU/L (ref 44–121)
BUN/Creatinine Ratio: 16 (ref 10–24)
BUN: 19 mg/dL (ref 8–27)
Bilirubin Total: 0.4 mg/dL (ref 0.0–1.2)
CO2: 24 mmol/L (ref 20–29)
Calcium: 9.6 mg/dL (ref 8.6–10.2)
Chloride: 100 mmol/L (ref 96–106)
Creatinine, Ser: 1.21 mg/dL (ref 0.76–1.27)
Globulin, Total: 3 g/dL (ref 1.5–4.5)
Glucose: 97 mg/dL (ref 70–99)
Potassium: 4.5 mmol/L (ref 3.5–5.2)
Sodium: 138 mmol/L (ref 134–144)
Total Protein: 7.5 g/dL (ref 6.0–8.5)
eGFR: 68 mL/min/{1.73_m2} (ref >59)

## 2022-02-22 ENCOUNTER — Encounter: Payer: Self-pay | Admitting: *Deleted

## 2022-03-04 ENCOUNTER — Encounter: Payer: Self-pay | Admitting: Family Medicine

## 2022-03-04 ENCOUNTER — Telehealth (INDEPENDENT_AMBULATORY_CARE_PROVIDER_SITE_OTHER): Payer: BC Managed Care – PPO | Admitting: Family Medicine

## 2022-03-04 DIAGNOSIS — R1013 Epigastric pain: Secondary | ICD-10-CM

## 2022-03-04 NOTE — Progress Notes (Signed)
[facility] Telemedicine Visit  Patient consented to have virtual visit and was identified by name and date of birth. Method of visit: Video  Encounter participants: Patient: Jared Holt - located at in [place]  Provider: Ronnald Ramp - located at [facility]   Chief Complaint: f/u abdominal pain and for oral sore  HPI:   Apthous ulcer  Resolved after using  normal mouthwash    Abdominal pain  Patient states that he has not noticed improvement with starting bentyl  He has been taking powder to help with abdominal  He has continued the PPI that does help with the pain  Last episode was this AM and pain subsides after taking medication   He does not have nausea associated with pain   Has not had alcohol in several years  Denies hematochezia, denies melena   He is able to eat after taking the PPI  Patient reports seeing an primary care physician in [place] for the same symptoms    ROS: per HPI  Pertinent PMHx:  HTN  BPH  Apthous ulcer   Exam:  There were no vitals taken for this visit.  Respiratory: speaking in full sentences, no signs of respiratory distress  General: well appearing in NAD    Assessment/Plan:  Abdominal pain, epigastric Chronic abdominal pain AVS responsive to PPI therapy We will discontinue Bentyl as patient did not have any improvement in his symptoms We will submit referral to gastroenterology for further evaluation and consideration of EGD given prolonged symptoms    Time spent during visit with patient: 10 minutes

## 2022-03-04 NOTE — Assessment & Plan Note (Signed)
Chronic abdominal pain AVS responsive to PPI therapy We will discontinue Bentyl as patient did not have any improvement in his symptoms We will submit referral to gastroenterology for further evaluation and consideration of EGD given prolonged symptoms

## 2022-03-22 ENCOUNTER — Encounter: Payer: Self-pay | Admitting: Family Medicine

## 2022-04-05 ENCOUNTER — Encounter: Payer: Self-pay | Admitting: Family Medicine

## 2022-05-24 ENCOUNTER — Encounter: Payer: Self-pay | Admitting: Family Medicine

## 2022-05-26 ENCOUNTER — Other Ambulatory Visit (HOSPITAL_COMMUNITY): Payer: Self-pay

## 2022-05-26 ENCOUNTER — Encounter (HOSPITAL_COMMUNITY): Payer: Self-pay

## 2022-05-26 ENCOUNTER — Ambulatory Visit (HOSPITAL_COMMUNITY)
Admission: EM | Admit: 2022-05-26 | Discharge: 2022-05-26 | Disposition: A | Discharge status: Home / Self Care | Payer: BC Managed Care – PPO | Attending: Physician Assistant | Admitting: Physician Assistant

## 2022-05-26 ENCOUNTER — Other Ambulatory Visit (HOSPITAL_BASED_OUTPATIENT_CLINIC_OR_DEPARTMENT_OTHER): Payer: Self-pay

## 2022-05-26 DIAGNOSIS — Z1152 Encounter for screening for COVID-19: Secondary | ICD-10-CM | POA: Diagnosis not present

## 2022-05-26 DIAGNOSIS — R350 Frequency of micturition: Secondary | ICD-10-CM | POA: Insufficient documentation

## 2022-05-26 DIAGNOSIS — R051 Acute cough: Secondary | ICD-10-CM | POA: Insufficient documentation

## 2022-05-26 DIAGNOSIS — R509 Fever, unspecified: Secondary | ICD-10-CM | POA: Diagnosis not present

## 2022-05-26 LAB — POCT URINALYSIS DIPSTICK, ED / UC
Bilirubin Urine: NEGATIVE
Glucose, UA: NEGATIVE mg/dL
Hgb urine dipstick: NEGATIVE
Ketones, ur: NEGATIVE mg/dL
Leukocytes,Ua: NEGATIVE
Nitrite: NEGATIVE
Protein, ur: NEGATIVE mg/dL
Specific Gravity, Urine: 1.015 (ref 1.005–1.030)
Urobilinogen, UA: 1 mg/dL (ref 0.0–1.0)
pH: 7.5 (ref 5.0–8.0)

## 2022-05-26 LAB — SARS CORONAVIRUS 2 (TAT 6-24 HRS): SARS Coronavirus 2: POSITIVE — AB

## 2022-05-26 MED ORDER — DM-GUAIFENESIN ER 30-600 MG PO TB12: 1.0000 | ORAL_TABLET | Freq: Two times a day (BID) | ORAL | 0 refills | Status: AC

## 2022-05-26 MED ORDER — DM-GUAIFENESIN ER 30-600 MG PO TB12
1.0000 | ORAL_TABLET | Freq: Two times a day (BID) | ORAL | 0 refills | Status: AC
  Filled 2022-05-26: qty 20, 10d supply, fill #0

## 2022-05-26 MED ORDER — TAMSULOSIN HCL 0.4 MG PO CAPS
0.4000 mg | ORAL_CAPSULE | Freq: Every day | ORAL | 3 refills | Status: AC
Start: 2022-05-26 — End: ?
  Filled 2022-05-26: qty 30, 30d supply, fill #0

## 2022-05-26 NOTE — ED Provider Notes (Signed)
[facility]    CSN: [id number] Arrival date & time: 05/26/22  0831      History   Chief Complaint Chief Complaint  Patient presents with   Chills   Cough   Dysuria    HPI Jared Holt is a 61 y.o. male.   62 year old male who presents with fever, cough, and frequency.  Patient relates for the past 2 to 3 days he has been having fever, 99-100, chills, body aches and pains, congestion with intermittent cough with clear production.  Patient indicates that he has been around a family member who has been diagnosed with COVID, and they are both in the same household.  Patient indicates that he has been vaccinated against COVID.  Patient denies loss of sense of smell or taste, mild stomach upset is present however this has been ongoing for longer than the past week.  He denies any diarrhea. Patient also indicates for the past week he has been having increased frequency of urination, usually going 4-5 times at night.  He denies any dysuria, or lower back pain.  Patient indicates that he does have a history of having BPH, he has seen a urologist in the past and has been put on Flomax which did help decrease his urinary frequency.  He has been out of this particular medicine for a while.  He denies having any lower back pain, repetitive heavy lifting over the past couple weeks, or long frequent transportation rides.  Patient indicates that there is a family history of diabetes with his sister.  He has no prior history of any problems with his glucose levels. Patient relates that he has taken Flomax for more than a year and did not have any problems or side effects from that particular medicine.  He relates that his sulfa allergy was when he was a child.  But he indicates he tolerated Flomax well.   Cough Associated symptoms: fever   Dysuria Presenting symptoms: no dysuria   Associated symptoms: fever and urinary frequency     Past Medical History:  Diagnosis Date   Arthritis     knees   Cataract    Glaucoma    High cholesterol    Hypertension    Pancreatitis    Weakness     Patient Active Problem List   Diagnosis Date Noted   Elevated serum creatinine 02/04/2022   Aphthous ulcer 02/04/2022   BPH (benign prostatic hyperplasia) 01/15/2022   Cervical radiculopathy 07/01/2021   Neck pain on left side 06/12/2021   Elevated liver enzymes 04/04/2017   Abdominal pain, epigastric 04/04/2017   Anterior knee pain, right 11/22/2016   Neutropenia (HCC) 10/07/2016   Risky sexual behavior 05/12/2016   Thrombocytopenia (HCC) 09/08/2015   Erectile dysfunction 09/08/2015   Dysfunction of eustachian tube 10/08/2013   LBP (low back pain) 09/10/2013   HLD (hyperlipidemia) 04/15/2013   Benign essential HTN 04/15/2013    Past Surgical History:  Procedure Laterality Date   CATARACT EXTRACTION, BILATERAL         Home Medications    Prior to Admission medications   Medication Sig Start Date End Date Taking? Authorizing Provider  dextromethorphan-guaiFENesin (MUCINEX DM) 30-600 MG 12hr tablet Take 1 tablet by mouth 2 (two) times daily. For cough and congestion. 05/26/22  Yes Ellsworth Lennox, PA-C  dextromethorphan-guaiFENesin [facility] DM) 30-600 MG 12hr tablet Take 1 tablet by mouth 2 (two) times daily. 05/26/22  Yes Ellsworth Lennox, PA-C  tamsulosin (FLOMAX) 0.4 MG CAPS capsule Take 1 capsule (  0.4 mg total) by mouth daily. 05/26/22  Yes Ellsworth Lennox, PA-C  diclofenac Sodium (VOLTAREN) 1 % GEL Apply 2 g topically daily as needed. 06/10/21   Towanda Octave, MD  esomeprazole (NEXIUM) 40 MG capsule Take 40 mg by mouth 2 (two) times daily with a meal.    [provider]  ezetimibe (ZETIA) 10 MG tablet Take 1 tablet (10 mg total) by mouth daily. 07/20/20   Mullis, Kiersten P, DO  latanoprost (XALATAN) 0.005 % ophthalmic solution Place 1 drop into both eyes at bedtime. 05/22/20   [provider]  lisinopril (ZESTRIL) 20 MG tablet Take 2 tablets (40 mg total) by mouth daily.  02/04/22   Simmons-Robinson, Tawanna Cooler, MD  magic mouthwash w/lidocaine SOLN Take 5 mLs by mouth 4 (four) times daily as needed for mouth pain. 02/04/22   Simmons-Robinson, Makiera, MD  pravastatin (PRAVACHOL) 40 MG tablet Take 1 tablet (40 mg total) by mouth daily. 06/11/21   Simmons-Robinson, Tawanna Cooler, MD    Family History Family History  Problem Relation Age of Onset   Heart failure Father    Diabetes Sister    Clotting disorder Brother    Other Brother        Covid 10   Diabetes Brother     Social History Social History   Tobacco Use   Smoking status: Former    Types: Cigarettes    Quit date: 2001    Years since quitting: 22.6   Smokeless tobacco: Never  Vaping Use   Vaping Use: Never used  Substance Use Topics   Alcohol use: Yes    Comment: social   Drug use: No     Allergies   Sulfamethoxazole, Statins, and Sulfa antibiotics   Review of Systems Review of Systems  Constitutional:  Positive for fatigue and fever.  Respiratory:  Positive for cough.   Genitourinary:  Positive for frequency. Negative for dysuria.     Physical Exam Triage Vital Signs ED Triage Vitals [05/26/22 0912]  Enc Vitals Group     BP 117/75     Pulse Rate 80     Resp 18     Temp 99.4 F (37.4 C)     Temp Source Oral     SpO2 96 %     Weight      Height      Head Circumference      Peak Flow      Pain Score      Pain Loc      Pain Edu?      Excl. in GC?    No data found.  Updated Vital Signs BP 117/75 (BP Location: Left Arm)   Pulse 80   Temp 99.4 F (37.4 C) (Oral)   Resp 18   SpO2 96%   Visual Acuity Right Eye Distance:   Left Eye Distance:   Bilateral Distance:    Right Eye Near:   Left Eye Near:    Bilateral Near:     Physical Exam Constitutional:      Appearance: Normal appearance.  HENT:     Right Ear: Tympanic membrane and ear canal normal.     Left Ear: Tympanic membrane and ear canal normal.     Mouth/Throat:     Mouth: Mucous membranes are moist.      Pharynx: Oropharynx is clear. No posterior oropharyngeal erythema.  Cardiovascular:     Rate and Rhythm: Normal rate and regular rhythm.     Heart sounds: Normal heart sounds.  Pulmonary:     Effort: Pulmonary effort is normal.     Breath sounds: Normal breath sounds and air entry. No wheezing, rhonchi or rales.  Abdominal:     General: Abdomen is flat. Bowel sounds are normal.     Palpations: Abdomen is soft.     Tenderness: There is abdominal tenderness. There is rebound. There is no guarding.  Lymphadenopathy:     Cervical: No cervical adenopathy.  Neurological:     Mental Status: He is alert.      UC Treatments / Results  Labs (all labs ordered are listed, but only abnormal results are displayed) Labs Reviewed  SARS CORONAVIRUS 2 (TAT 6-24 HRS)  POCT URINALYSIS DIPSTICK, ED / UC    EKG   Radiology No results found.  Procedures Procedures (including critical care time)  Medications Ordered in UC Medications - No data to display  Initial Impression / Assessment and Plan / UC Course  I have reviewed the triage vital signs and the nursing notes.  Pertinent labs & imaging results that were available during my care of the patient were reviewed by me and considered in my medical decision making (see chart for details).    Plan: 1.  Advised to take Tylenol or ibuprofen for fever, aches and pains. 2.  Advised to take Mucinex DM for cough and chest congestion. 3.  COVID test is pending. 4.  Advised to follow-up with PCP or return to urgent care if symptoms fail to improve. 5.  Advised to take Flomax daily to help control BPH symptoms.  Follow-up with urology for further treatment. Final Clinical Impressions(s) / UC Diagnoses   Final diagnoses:  Acute cough  Frequency of urination  Encounter for screening for COVID-19  Fever, unspecified     Discharge Instructions      COVID test will be completed in 24 hours or less.  If you do not get a call from this  office that indicates the test is negative.  He can go on MyChart to view the test results when it post in less than 24 hours.  Advised to continue using precautions, wearing a mask on a regular basis to protect others and those that you are around and on normal basis. Advised to decrease caffeine intake and soft drinks, and coffee. Advised take Tylenol or Motrin for aches pains and fever.  As bilateral respiratory infections can be treated symptomatically. Has follow-up PCP or return to urgent care if symptoms fail to improve.    ED Prescriptions     Medication Sig Dispense Auth. Provider   dextromethorphan-guaiFENesin (MUCINEX DM) 30-600 MG 12hr tablet Take 1 tablet by mouth 2 (two) times daily. For cough and congestion. 14 tablet Ellsworth Lennox, PA-C   tamsulosin (FLOMAX) 0.4 MG CAPS capsule Take 1 capsule (0.4 mg total) by mouth daily. 30 capsule Ellsworth Lennox, PA-C   dextromethorphan-guaiFENesin [facility] DM) 30-600 MG 12hr tablet Take 1 tablet by mouth 2 (two) times daily. 14 tablet Ellsworth Lennox, PA-C      PDMP not reviewed this encounter.   Ellsworth Lennox, PA-C 05/26/22 [phone]

## 2022-05-26 NOTE — Discharge Instructions (Addendum)
COVID test will be completed in 24 hours or less.  If you do not get a call from this office that indicates the test is negative.  He can go on MyChart to view the test results when it post in less than 24 hours.  Advised to continue using precautions, wearing a mask on a regular basis to protect others and those that you are around and on normal basis. Advised to decrease caffeine intake and soft drinks, and coffee. Advised take Tylenol or Motrin for aches pains and fever.  As bilateral respiratory infections can be treated symptomatically. Has follow-up PCP or return to urgent care if symptoms fail to improve.

## 2022-05-26 NOTE — ED Triage Notes (Signed)
Onset 2-3 days of chills. Cough and body aches. Pt report frequent urination.

## 2022-05-27 ENCOUNTER — Telehealth (HOSPITAL_COMMUNITY): Payer: Self-pay | Admitting: Emergency Medicine

## 2022-05-27 ENCOUNTER — Encounter: Payer: Self-pay | Admitting: Family Medicine

## 2022-05-27 MED ORDER — MOLNUPIRAVIR EUA 200MG CAPSULE: 4.0000 | ORAL_CAPSULE | Freq: Two times a day (BID) | ORAL | 0 refills | Status: AC

## 2022-05-27 NOTE — Telephone Encounter (Signed)
Called patient regarding patient advice messages. Patient went to urgent care for COVID symptoms. Had positive covid test. He was prescribed molnupiravir. He asked regarding paxlovid. He also was told he should stop his pravastatin during this period of time. I advised him that molnupiravir interacts with simvastatin and rosuvastatin but not with pravastatin so he could continue. I advised him to seek medical care if his symptoms continued.

## 2022-06-02 ENCOUNTER — Encounter: Payer: Self-pay | Admitting: Family Medicine

## 2022-06-06 ENCOUNTER — Telehealth: Payer: Self-pay | Admitting: Family Medicine

## 2022-06-06 NOTE — Telephone Encounter (Signed)
Patient seems to have moved and so okay for patient to return to the practice.  He can be seen by one of the APP's or myself for further evaluation.  Also, please find out when his last colonoscopy was so we can determine the potential role of colonoscopic evaluation and if it has been done elsewhere so that those records can be obtained. Thanks. GM

## 2022-06-06 NOTE — Telephone Encounter (Signed)
Dr. Rush Landmark,  We received a referral for the patient.  He was established with you but recently saw a GI provider in [place] in July 2023.  He is requesting to schedule another appt with [facility].  Please advise scheduling?

## 2022-08-08 ENCOUNTER — Other Ambulatory Visit (HOSPITAL_BASED_OUTPATIENT_CLINIC_OR_DEPARTMENT_OTHER): Payer: Self-pay

## 2022-12-24 LAB — EXTERNAL GENERIC LAB PROCEDURE: COLOGUARD: NEGATIVE

## 2023-07-21 IMAGING — MR MR CERVICAL SPINE W/O CM
4 of 5 series · 18 of 48 positions shown · non-contrast
Comparison: None.

CLINICAL DATA: Chronic neck pain, left side behind ear radiating to
left shoulder

EXAM:
MRI CERVICAL SPINE WITHOUT CONTRAST
TECHNIQUE: Multiplanar, multisequence MR imaging of the cervical spine was
performed. No intravenous contrast was administered.

[Series 2: T2 · sagittal · 3.0mm · 0.43mm/px · 6 of 18 slices shown (1 of 2)]
[im 1/18]
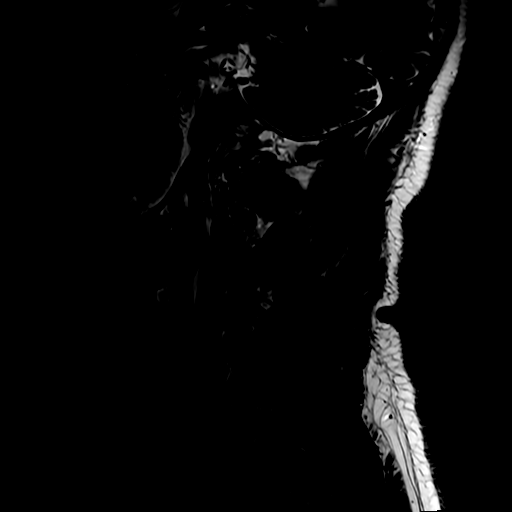
[im 4/18]
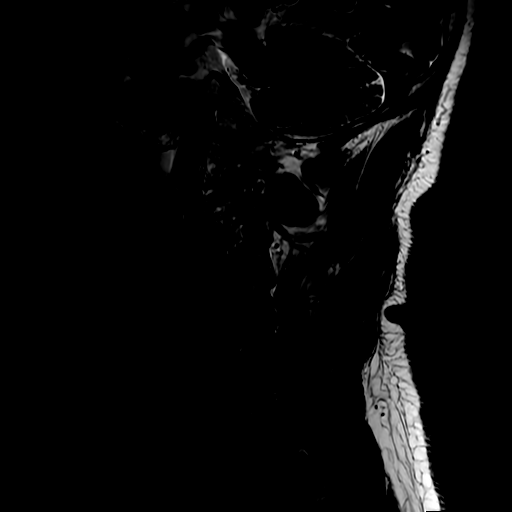
[im 7/18]
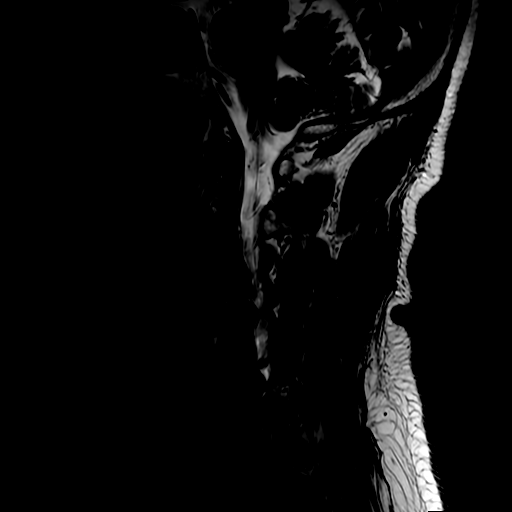
[im 11/18]
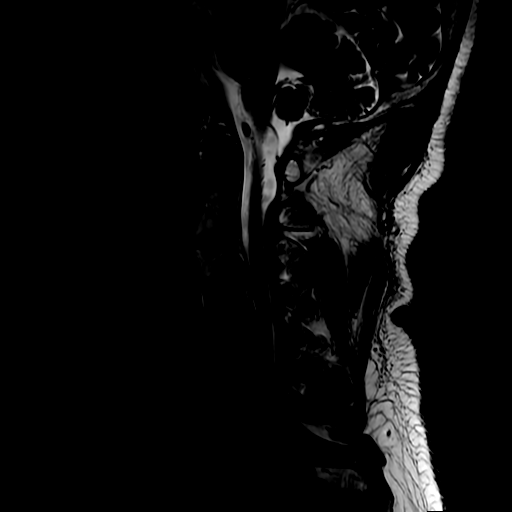
[im 14/18]
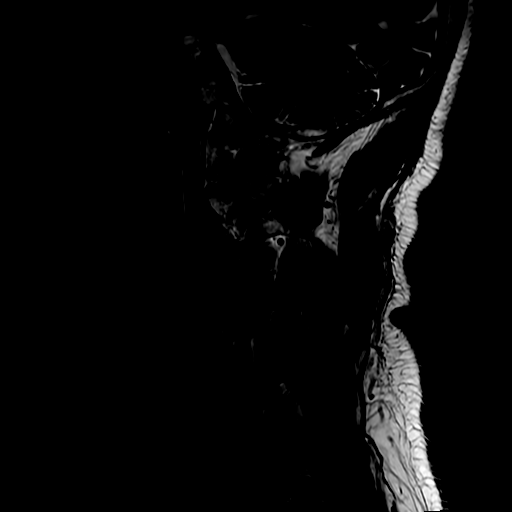
[im 18/18]
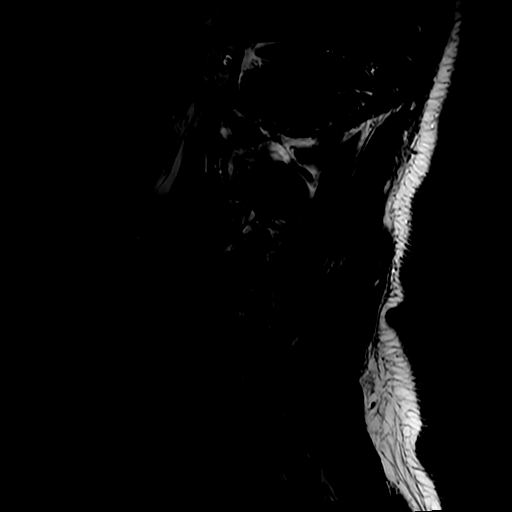

[Series 3: STIR · sagittal · 3.0mm · 0.43mm/px · 3 of 18 slices shown]
[im 1/18]
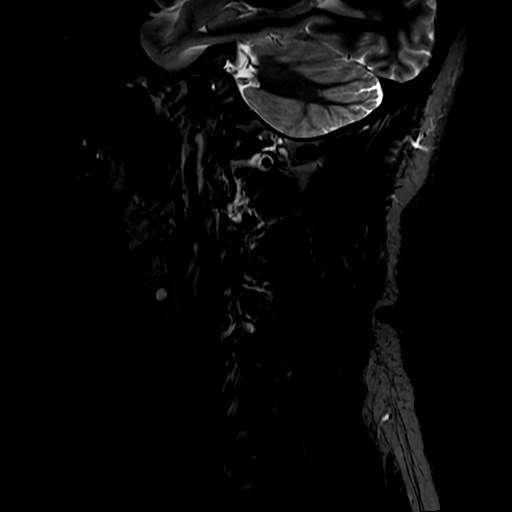
[im 9/18]
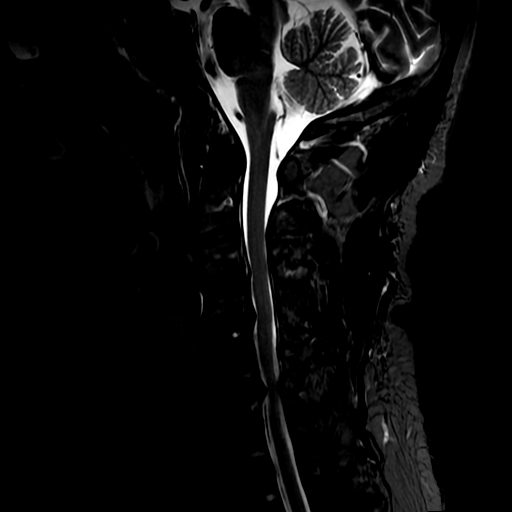
[im 18/18]
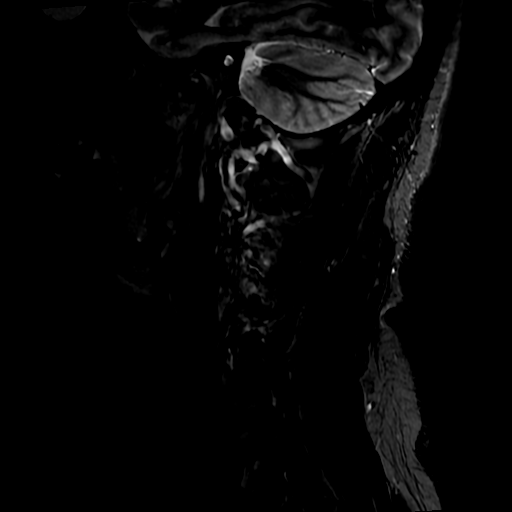

[Series 4: FLAIR · sagittal · 3.0mm · 0.43mm/px · 3 of 18 slices shown]
[im 1/18]
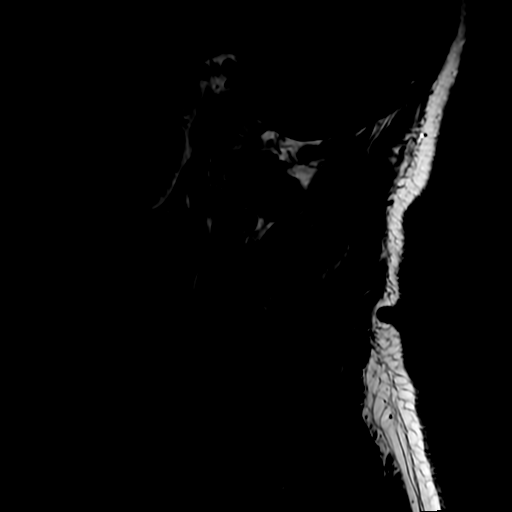
[im 9/18]
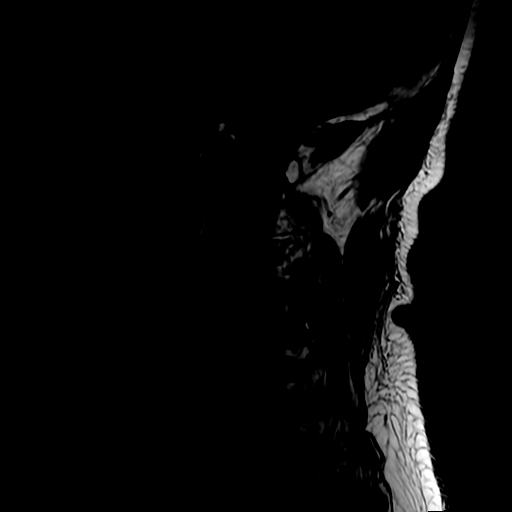
[im 18/18]
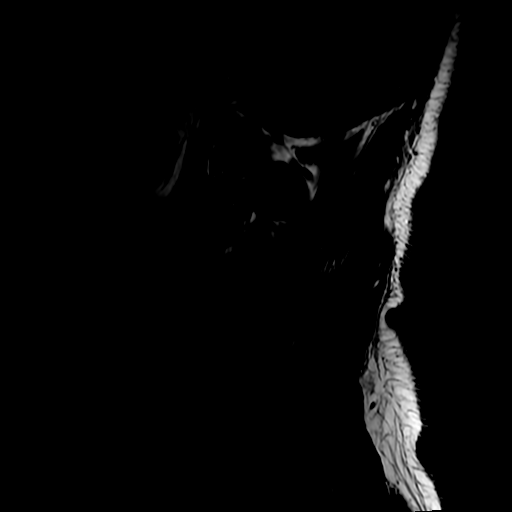

[Series 6: T2 · axial · 3.0mm · 0.35mm/px · z∈[-92,-13]mm · 6 of 29 slices shown (2 of 2)]
[im 1/29]
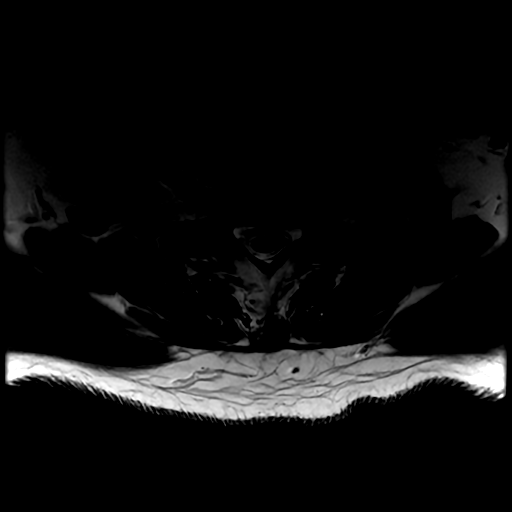
[im 5/29]
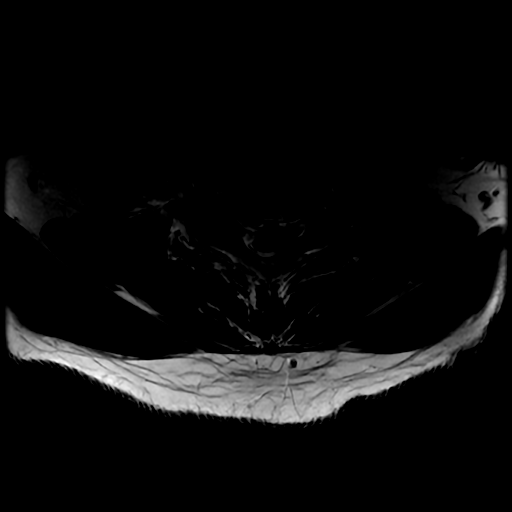
[im 9/29]
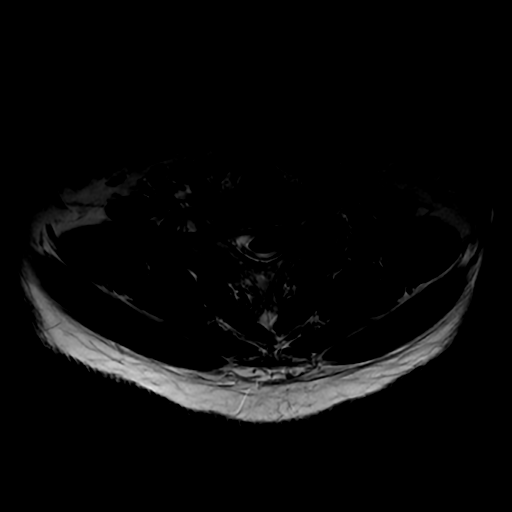
[im 13/29]
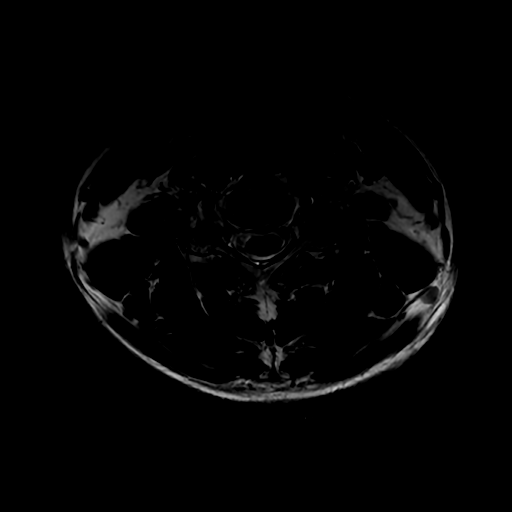
[im 17/29]
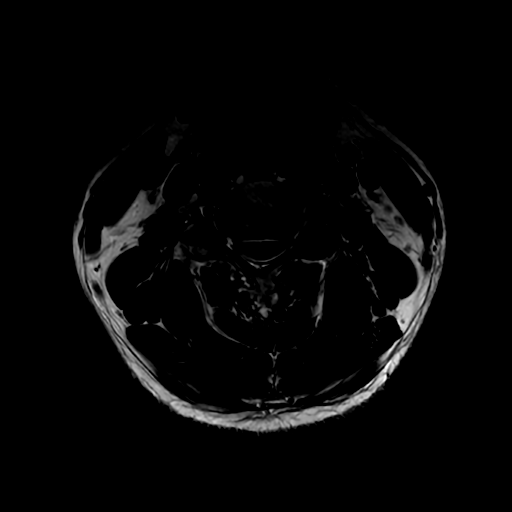
[im 25/29]
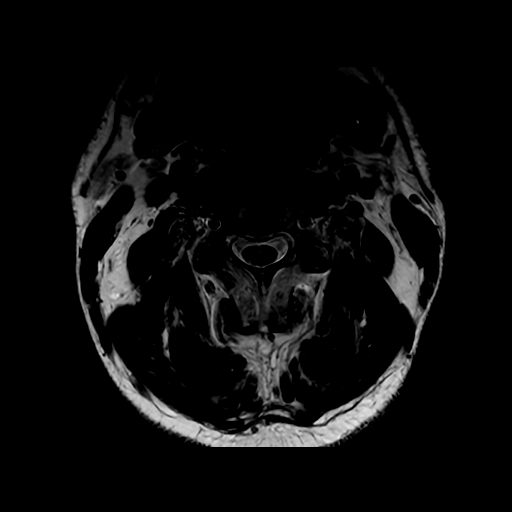

[18 of 48 positions shown; findings below may reference images not displayed]

FINDINGS: Alignment: There is straightening of the normal cervical spine
lordosis. There is no antero or retrolisthesis.

Vertebrae: Vertebral body heights are preserved. There is mild
degenerative endplate marrow signal abnormality at C6-C7. There is
no suspicious marrow signal abnormality.

Cord: Normal in signal and morphology.

Posterior Fossa, vertebral arteries, paraspinal tissues: The
paraspinal soft tissues are unremarkable. The imaged posterior fossa
is unremarkable.

Disc levels:

There is multilevel disc desiccation and narrowing, most advanced at
C4-C5 and C6-C7.

C2-C3: No significant spinal canal or neural foraminal stenosis.

C3-C4: There is uncovertebral and bilateral facet arthropathy
resulting in mild spinal canal stenosis and moderate bilateral
neural foraminal stenosis.

C4-C5: There is a posterior disc osteophyte complex with a right
foraminal protrusion, and uncovertebral and facet arthropathy
resulting in mild spinal canal stenosis and severe right and
moderate to severe left neural foraminal stenosis.

C5-C6: There is mild uncovertebral and facet arthropathy without
significant spinal canal or neural foraminal stenosis.

C6-C7: There is a posterior disc osteophyte complex with a
broad-based right foraminal protrusion and uncovertebral and facet
arthropathy resulting in moderate spinal canal stenosis with
indentation of the ventral cord and severe right and moderate left
neural foraminal stenosis

C7-T1: No significant spinal canal or neural foraminal stenosis.
IMPRESSION: 1. Multilevel degenerative changes through the cervical spine as
above, most advanced at C6-C7 where there is a posterior disc
osteophyte complex with a right foraminal protrusion resulting in
moderate spinal canal stenosis and severe right and moderate left
neural foraminal stenosis.
2. Degenerative changes at C4-C5 result in mild spinal canal
stenosis and severe right and moderate to severe left neural
foraminal stenosis.
3. Moderate bilateral neural foraminal stenosis at C3-C4.

## 2023-09-16 ENCOUNTER — Other Ambulatory Visit: Payer: Self-pay

## 2023-09-16 ENCOUNTER — Encounter (HOSPITAL_COMMUNITY): Payer: Self-pay | Admitting: Emergency Medicine

## 2023-09-16 ENCOUNTER — Ambulatory Visit (HOSPITAL_COMMUNITY)
Admission: EM | Admit: 2023-09-16 | Discharge: 2023-09-16 | Disposition: A | Discharge status: Home / Self Care | Payer: 59 | Attending: Family Medicine | Admitting: Family Medicine

## 2023-09-16 DIAGNOSIS — L816 Other disorders of diminished melanin formation: Secondary | ICD-10-CM | POA: Diagnosis not present

## 2023-09-16 DIAGNOSIS — R319 Hematuria, unspecified: Secondary | ICD-10-CM

## 2023-09-16 LAB — POCT URINALYSIS DIP (MANUAL ENTRY)
Bilirubin, UA: NEGATIVE
Glucose, UA: NEGATIVE mg/dL
Ketones, POC UA: NEGATIVE mg/dL
Leukocytes, UA: NEGATIVE
Nitrite, UA: NEGATIVE
Protein Ur, POC: NEGATIVE mg/dL
Spec Grav, UA: <=1.005 — AB (ref 1.010–1.025)
Urobilinogen, UA: 0.2 U/dL
pH, UA: 6 (ref 5.0–8.0)

## 2023-09-16 NOTE — ED Notes (Signed)
At bedside for provider examination of troubled area

## 2023-09-16 NOTE — Discharge Instructions (Addendum)
It was nice seeing you. Your urine is negative for infection. However, you do have trace blood in the urine. Please repeat urine test soon with your PCP or urologist.  You do have hypopigmentation of the skin of your penis. Otherwise, normal exam. This could be due to the cream you are using, especially the one with steroids. Please stop all creams and allow your penile skin time to recover the pigmentation.

## 2023-09-16 NOTE — ED Provider Notes (Signed)
[facility]    CSN: [id number] Arrival date & time: 09/16/23  1304      History   Chief Complaint Chief Complaint  Patient presents with   Groin Pain    HPI Jared Holt is a 62 y.o. male.   The history is provided by the patient. No language interpreter was used.  Groin Pain Chronicity: He used an OTC hair removal cream in Sept 2024 and then used again 8 weeks later and then noticed some redness of his penis. He went to his PCP who gave him Clotrimazole and betamethasone with no improvement of his symptoms. Episode onset: Symptoms started about 4 weeks ago. The problem occurs constantly. The problem has not changed since onset.Pertinent negatives include no abdominal pain. Associated symptoms comments: No penile discharge, no change in his urine color. Denies being sexually active. Last sexual activity was in 2021. He said he has no concerns about STDs. He sees an urologist and they tested him for STDs which were negative. Nothing relieves the symptoms.    Past Medical History:  Diagnosis Date   Arthritis    knees   Cataract    Glaucoma    High cholesterol    Hypertension    Pancreatitis    Weakness     Patient Active Problem List   Diagnosis Date Noted   Elevated serum creatinine 02/04/2022   Aphthous ulcer 02/04/2022   BPH (benign prostatic hyperplasia) 01/15/2022   Cervical radiculopathy 07/01/2021   Neck pain on left side 06/12/2021   Elevated liver enzymes 04/04/2017   Abdominal pain, epigastric 04/04/2017   Anterior knee pain, right 11/22/2016   Neutropenia (HCC) 10/07/2016   Risky sexual behavior 05/12/2016   Thrombocytopenia (HCC) 09/08/2015   Erectile dysfunction 09/08/2015   Dysfunction of eustachian tube 10/08/2013   Low back pain 09/10/2013   HLD (hyperlipidemia) 04/15/2013   Benign essential HTN 04/15/2013    Past Surgical History:  Procedure Laterality Date   CATARACT EXTRACTION, BILATERAL         Home Medications    Prior  to Admission medications   Medication Sig Start Date End Date Taking? Authorizing Provider  dextromethorphan-guaiFENesin (MUCINEX DM) 30-600 MG 12hr tablet Take 1 tablet by mouth 2 (two) times daily. For cough and congestion. Patient not taking: Reported on 09/16/2023 05/26/22   Ellsworth Lennox, PA-C  dextromethorphan-guaiFENesin [facility] DM) 30-600 MG 12hr tablet Take 1 tablet by mouth 2 (two) times daily. Patient not taking: Reported on 09/16/2023 05/26/22   Ellsworth Lennox, PA-C  diclofenac Sodium (VOLTAREN) 1 % GEL Apply 2 g topically daily as needed. 06/10/21   Cathleen Corti, MD  esomeprazole (NEXIUM) 40 MG capsule Take 40 mg by mouth 2 (two) times daily with a meal.    [provider]  ezetimibe (ZETIA) 10 MG tablet Take 1 tablet (10 mg total) by mouth daily. 07/20/20   Mullis, Kiersten P, DO  latanoprost (XALATAN) 0.005 % ophthalmic solution Place 1 drop into both eyes at bedtime. 05/22/20   [provider]  lisinopril (ZESTRIL) 20 MG tablet Take 2 tablets (40 mg total) by mouth daily. 02/04/22   Simmons-Robinson, Tawanna Cooler, MD  magic mouthwash w/lidocaine SOLN Take 5 mLs by mouth 4 (four) times daily as needed for mouth pain. 02/04/22   Simmons-Robinson, Makiera, MD  pravastatin (PRAVACHOL) 40 MG tablet Take 1 tablet (40 mg total) by mouth daily. 06/11/21   Simmons-Robinson, Tawanna Cooler, MD  tamsulosin (FLOMAX) 0.4 MG CAPS capsule Take 1 capsule (0.4 mg total) by  mouth daily. 05/26/22   Ellsworth Lennox, PA-C    Family History Family History  Problem Relation Age of Onset   Heart failure Father    Diabetes Sister    Clotting disorder Brother    Other Brother        Covid 20   Diabetes Brother     Social History Social History   Tobacco Use   Smoking status: Former    Current packs/day: 0.00    Types: Cigarettes    Quit date: 2001    Years since quitting: 24.0   Smokeless tobacco: Never  Vaping Use   Vaping status: Never Used  Substance Use Topics   Alcohol use: Not  Currently    Comment: social   Drug use: No     Allergies   Sulfamethoxazole, Statins, and Sulfa antibiotics   Review of Systems Review of Systems  Gastrointestinal:  Negative for abdominal pain.  All other systems reviewed and are negative.    Physical Exam Triage Vital Signs ED Triage Vitals  Encounter Vitals Group     BP      Systolic BP Percentile      Diastolic BP Percentile      Pulse      Resp      Temp      Temp src      SpO2      Weight      Height      Head Circumference      Peak Flow      Pain Score      Pain Loc      Pain Education      Exclude from Growth Chart    No data found.  Updated Vital Signs BP 133/87 (BP Location: Left Arm)   Pulse 62   Temp 97.8 F (36.6 C) (Oral)   Resp 18   SpO2 96%   Visual Acuity Right Eye Distance:   Left Eye Distance:   Bilateral Distance:    Right Eye Near:   Left Eye Near:    Bilateral Near:     Physical Exam Vitals and nursing note reviewed. Exam conducted with a chaperone present Selena Batten).  Cardiovascular:     Rate and Rhythm: Normal rate and regular rhythm.     Heart sounds: Normal heart sounds. No murmur heard. Pulmonary:     Effort: Pulmonary effort is normal. No respiratory distress.     Breath sounds: Normal breath sounds. No wheezing.  Abdominal:     General: Abdomen is flat. Bowel sounds are normal. There is no distension.     Palpations: Abdomen is soft. There is no mass.     Tenderness: There is no abdominal tenderness. There is no guarding.  Genitourinary:    Penis: Normal and circumcised. No tenderness, discharge, swelling or lesions.      Testes: Normal.     Comments: No hernia or inguinal/femoral lymphadenopathy. Hypopigmentation of the ventral surface of his penis     UC Treatments / Results  Labs (all labs ordered are listed, but only abnormal results are displayed) Labs Reviewed  POCT URINALYSIS DIP (MANUAL ENTRY) - Abnormal; Notable for the following components:       Result Value   Color, UA light yellow (*)    Spec Grav, UA <=1.005 (*)    Blood, UA trace-intact (*)    All other components within normal limits    EKG   Radiology No results found.  Procedures Procedures (including critical care  time)  Medications Ordered in UC Medications - No data to display  Initial Impression / Assessment and Plan / UC Course  I have reviewed the triage vital signs and the nursing notes.  Pertinent labs & imaging results that were available during my care of the patient were reviewed by me and considered in my medical decision making (see chart for details).  Clinical Course as of 09/16/23 1532  Sat Sep 16, 2023  1530 Penile hypopigmentation Likely related to steroid use vs OTC hair removal cream No signs of infection STD not needed Discussed avoidance of all of these cream and allow time for his skin pigment to be restored Monitor closely for now He agreed with the plan [KE]  1531 Trace blood in urine Microscopy not checked Likely benign Repeat UA with PCP/Urology discussed He agreed with the plan  [KE]    Clinical Course User Index [KE] Doreene Eland, MD     Final Clinical Impressions(s) / UC Diagnoses   Final diagnoses:  None     Discharge Instructions      It was nice seeing you. Your urine is negative for infection. However, you do have trace blood in the urine. Please repeat urine test soon with your PCP or urologist.  You do have hypopigmentation of the skin of your penis. Otherwise, normal exam. This could be due to the cream you are using, especially the one with steroids. Please stop all creams and allow your penile skin time to recover the pigmentation.      ED Prescriptions   None    PDMP not reviewed this encounter.   Doreene Eland, MD 09/16/23 [phone]

## 2023-09-16 NOTE — ED Triage Notes (Signed)
Patient has used a hair removal product.  The first time, no issue.  The second tome, developed redness.  Patient used Vaseline.  Patient has seen his PCP gave clotrimazole and betamethasone cream-patient does not feel cream has helped
